# Patient Record
Sex: Female | Born: 1955 | Race: Black or African American | Hispanic: No | Marital: Married | Smoking: Never smoker
Health system: Southern US, Community
[De-identification: ages and names within clinical notes are randomized; demographics above are authoritative.]

## PROBLEM LIST (undated history)

## (undated) DIAGNOSIS — F99 Mental disorder, not otherwise specified: Secondary | ICD-10-CM

## (undated) DIAGNOSIS — I82409 Acute embolism and thrombosis of unspecified deep veins of unspecified lower extremity: Secondary | ICD-10-CM

## (undated) DIAGNOSIS — F419 Anxiety disorder, unspecified: Secondary | ICD-10-CM

## (undated) DIAGNOSIS — I739 Peripheral vascular disease, unspecified: Secondary | ICD-10-CM

---

## 2005-09-21 ENCOUNTER — Emergency Department (HOSPITAL_COMMUNITY): Admission: EM | Admit: 2005-09-21 | Discharge: 2005-09-21 | Payer: Self-pay | Admitting: Family Medicine

## 2005-12-03 ENCOUNTER — Emergency Department (HOSPITAL_COMMUNITY): Admission: EM | Admit: 2005-12-03 | Discharge: 2005-12-03 | Payer: Self-pay | Admitting: Emergency Medicine

## 2005-12-03 ENCOUNTER — Emergency Department (HOSPITAL_COMMUNITY): Admission: EM | Admit: 2005-12-03 | Discharge: 2005-12-04 | Payer: Self-pay | Admitting: Emergency Medicine

## 2005-12-04 ENCOUNTER — Inpatient Hospital Stay (HOSPITAL_COMMUNITY): Admission: RE | Admit: 2005-12-04 | Discharge: 2005-12-07 | Payer: Self-pay | Admitting: *Deleted

## 2005-12-04 ENCOUNTER — Ambulatory Visit: Payer: Self-pay | Admitting: Psychiatry

## 2005-12-06 ENCOUNTER — Emergency Department (HOSPITAL_COMMUNITY): Admission: EM | Admit: 2005-12-06 | Discharge: 2005-12-06 | Payer: Self-pay | Admitting: Emergency Medicine

## 2007-07-08 ENCOUNTER — Encounter: Admission: RE | Admit: 2007-07-08 | Discharge: 2007-07-08 | Payer: Self-pay | Admitting: Internal Medicine

## 2007-11-18 ENCOUNTER — Encounter: Admission: RE | Admit: 2007-11-18 | Discharge: 2007-11-18 | Payer: Self-pay | Admitting: Internal Medicine

## 2007-12-02 ENCOUNTER — Inpatient Hospital Stay (HOSPITAL_COMMUNITY): Admission: RE | Admit: 2007-12-02 | Discharge: 2007-12-07 | Payer: Self-pay | Admitting: Psychiatry

## 2007-12-02 ENCOUNTER — Ambulatory Visit: Payer: Self-pay | Admitting: Psychiatry

## 2007-12-19 ENCOUNTER — Emergency Department (HOSPITAL_COMMUNITY): Admission: EM | Admit: 2007-12-19 | Discharge: 2007-12-19 | Payer: Self-pay | Admitting: Emergency Medicine

## 2007-12-26 ENCOUNTER — Ambulatory Visit (HOSPITAL_COMMUNITY): Payer: Self-pay | Admitting: Psychiatry

## 2008-01-27 ENCOUNTER — Ambulatory Visit (HOSPITAL_COMMUNITY): Payer: Self-pay | Admitting: Psychiatry

## 2008-02-26 ENCOUNTER — Ambulatory Visit (HOSPITAL_COMMUNITY): Payer: Self-pay | Admitting: Psychiatry

## 2008-06-15 ENCOUNTER — Ambulatory Visit (HOSPITAL_COMMUNITY): Payer: Self-pay | Admitting: Psychiatry

## 2008-07-28 ENCOUNTER — Ambulatory Visit (HOSPITAL_COMMUNITY): Payer: Self-pay | Admitting: Psychiatry

## 2008-10-27 ENCOUNTER — Ambulatory Visit (HOSPITAL_COMMUNITY): Payer: Self-pay | Admitting: Psychiatry

## 2009-01-26 ENCOUNTER — Ambulatory Visit (HOSPITAL_COMMUNITY): Payer: Self-pay | Admitting: Psychiatry

## 2009-04-27 ENCOUNTER — Ambulatory Visit (HOSPITAL_COMMUNITY): Payer: Self-pay | Admitting: Psychiatry

## 2009-08-20 ENCOUNTER — Ambulatory Visit (HOSPITAL_COMMUNITY): Payer: Self-pay | Admitting: Psychiatry

## 2009-12-17 ENCOUNTER — Ambulatory Visit (HOSPITAL_COMMUNITY): Payer: Self-pay | Admitting: Psychiatry

## 2010-04-01 ENCOUNTER — Ambulatory Visit (HOSPITAL_COMMUNITY): Payer: Self-pay | Admitting: Psychiatry

## 2010-07-01 ENCOUNTER — Ambulatory Visit (HOSPITAL_COMMUNITY): Payer: Self-pay | Admitting: Psychiatry

## 2010-11-11 ENCOUNTER — Encounter (HOSPITAL_COMMUNITY): Payer: Self-pay | Admitting: Physician Assistant

## 2010-12-06 ENCOUNTER — Encounter (HOSPITAL_COMMUNITY): Payer: Medicaid Other | Admitting: Physician Assistant

## 2010-12-06 DIAGNOSIS — F3110 Bipolar disorder, current episode manic without psychotic features, unspecified: Secondary | ICD-10-CM
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
## 2010-12-06 NOTE — Discharge Summary (Signed)
NAMEFLORANCE, PAOLILLO NO.:  [id number]   MEDICAL RECORD NO.:  [id number]          PATIENT TYPE:  IPS   LOCATION:  0407                          FACILITY:  [facility]   PHYSICIAN:  Antonietta Breach, M.D.  DATE OF BIRTH:  12/25/1955   DATE OF ADMISSION:  12/02/2007  DATE OF DISCHARGE:  12/07/2007                               DISCHARGE SUMMARY   IDENTIFYING DATA/REASON FOR ADMISSION:  Ms. Laurie Hunt is a 55-year-  old female admitted to the [facility] inpatient psychiatric unit on Dec 02, 2007, with racing thoughts, increased energy, decreased need for  sleep, impaired judgment, pressured speech and poor insight.  The  patient was on 187.5 mg of Effexor per day along with Tegretol 200 mg  t.i.d.   She was intrusive and socially disruptive and grandiose.   Medical and laboratory unremarkable.   HOSPITAL COURSE:  Ms. Laurie Hunt was admitted to the adult inpatient  psychiatric unit and underwent milieu and group psychotherapy.  She was  started on Abilify which was titrated to 20 mg daily.  Her Tegretol was  increased to 200 mg q.i.d.  Her Effexor was discontinued.   Her thought process progressively decreased in speed down to normal.  Her speech became non-pressured.  Her affect became normal.  She became  socially appropriate.  Her judgment returned.   CONDITION ON DISCHARGE:  By Dec 07, 2007, the patient was socially  appropriate.  She had met with her son and daughter along with staff and  they all concurred that the patient was socially appropriate for  discharge.  She was not having any thoughts of harming herself or  others.  She was expressing motivation to continue her psychiatric care  as an outpatient.   AFTERCARE:  The patient will use emergency services as needed for any  psychiatric emergency symptoms.   The patient has follow up with Yolande Jolly, PA, at the [facility].   DISCHARGE MEDICATIONS:  1. Abilify 20 mg p.o. nightly.  2. Tegretol 200 mg 2 tablets twice daily.   DISCHARGE DIAGNOSES:  Axis I:  Bipolar I disorder, manic, now in  clinically remission.  Axis II:  None.  Axis III:  None.  Axis V:  Primary support group.  Axis V:  Global Assessment of Functioning 55.      Antonietta Breach, M.D.  Electronically Signed     JW/MEDQ  D:  12/11/2007  T:  12/11/2007  Job:  [id number]
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
## 2011-04-11 ENCOUNTER — Encounter (HOSPITAL_COMMUNITY): Payer: Medicaid Other | Admitting: Physician Assistant

## 2011-04-19 LAB — URINALYSIS, ROUTINE W REFLEX MICROSCOPIC
Glucose, UA: NEGATIVE
Hgb urine dipstick: NEGATIVE
Nitrite: NEGATIVE
Urobilinogen, UA: 0.2

## 2011-04-19 LAB — POCT I-STAT, CHEM 8
BUN: 8
Chloride: 100
Creatinine, Ser: 0.8
Glucose, Bld: 98
Sodium: 136

## 2011-04-19 LAB — POCT CARDIAC MARKERS
CKMB, poc: 2.1
Myoglobin, poc: 47
Myoglobin, poc: 49.9
Myoglobin, poc: 58.5
Operator id: 114141
Operator id: 282201
Operator id: 284141

## 2011-04-19 LAB — DIFFERENTIAL
Eosinophils Relative: 0
Lymphocytes Relative: 24
Lymphs Abs: 1.5
Monocytes Relative: 7
Neutro Abs: 4.3
Neutrophils Relative %: 68

## 2011-04-19 LAB — URINE MICROSCOPIC-ADD ON

## 2011-04-19 LAB — RAPID URINE DRUG SCREEN, HOSP PERFORMED
Amphetamines: NOT DETECTED
Benzodiazepines: NOT DETECTED
Cocaine: NOT DETECTED
Tetrahydrocannabinol: NOT DETECTED

## 2011-04-19 LAB — CBC
MCV: 84.6
Platelets: 323
RDW: 13.3
WBC: 6.3

## 2011-05-16 ENCOUNTER — Encounter (HOSPITAL_COMMUNITY): Payer: Medicaid Other | Admitting: Physician Assistant

## 2011-06-02 ENCOUNTER — Other Ambulatory Visit (HOSPITAL_COMMUNITY): Payer: Self-pay | Admitting: Physician Assistant

## 2011-06-02 DIAGNOSIS — F3110 Bipolar disorder, current episode manic without psychotic features, unspecified: Secondary | ICD-10-CM

## 2011-06-02 MED ORDER — BENZTROPINE MESYLATE 0.5 MG PO TABS: 0.5000 mg | ORAL_TABLET | Freq: Two times a day (BID) | ORAL | Status: DC

## 2011-06-02 MED ORDER — ARIPIPRAZOLE 30 MG PO TABS: 30.0000 mg | ORAL_TABLET | Freq: Every day | ORAL | Status: DC

## 2011-06-22 ENCOUNTER — Other Ambulatory Visit (HOSPITAL_COMMUNITY): Payer: Self-pay | Admitting: Physician Assistant

## 2011-06-27 ENCOUNTER — Other Ambulatory Visit (HOSPITAL_COMMUNITY): Payer: Self-pay | Admitting: Physician Assistant

## 2011-06-29 ENCOUNTER — Ambulatory Visit (INDEPENDENT_AMBULATORY_CARE_PROVIDER_SITE_OTHER): Payer: Medicare Other | Admitting: Physician Assistant

## 2011-06-29 DIAGNOSIS — F3110 Bipolar disorder, current episode manic without psychotic features, unspecified: Secondary | ICD-10-CM

## 2011-07-12 NOTE — Progress Notes (Signed)
   [facility] Behavioral Health Follow-up Outpatient Visit  Laurie Hunt 09-23-1955  Date: 06/29/11   Subjective:  Laurie Hunt reports she is doing extremely well. She reports that her mood is stable. She has been happy. She denies any suicidal or homicidal ideation. She denies any paranoia, auditory or visual hallucinations. She endorses good sleep and appetite. She continues to spend a lot of time at [employer], all using the computers. She has no plans to return to school at this time.  There were no vitals filed for this visit.  Mental Status Examination  Appearance: Well groomed and dressed Alert: Yes Attention: good  Cooperative: Yes Eye Contact: Good Speech: Clear and even Psychomotor Activity: Normal Memory/Concentration: Intact Oriented: person, place, time/date and situation Mood: Euphoric Affect: Bizarre Thought Processes and Associations: Logical Fund of Knowledge: Fair Thought Content:  Insight: Fair Judgement: Good  Diagnosis: Bipolar disorder, most recent episode manic  Treatment Plan: Continue medications as prescribed and return for followup in 6 months.  Laurie Bostock, PA

## 2011-07-21 ENCOUNTER — Other Ambulatory Visit (HOSPITAL_COMMUNITY): Payer: Self-pay | Admitting: Physician Assistant

## 2011-07-21 DIAGNOSIS — F29 Unspecified psychosis not due to a substance or known physiological condition: Secondary | ICD-10-CM

## 2011-09-29 ENCOUNTER — Other Ambulatory Visit (HOSPITAL_COMMUNITY): Payer: Self-pay | Admitting: Physician Assistant

## 2011-10-18 ENCOUNTER — Other Ambulatory Visit (HOSPITAL_COMMUNITY): Payer: Self-pay | Admitting: Physician Assistant

## 2011-10-18 DIAGNOSIS — F319 Bipolar disorder, unspecified: Secondary | ICD-10-CM

## 2011-12-28 ENCOUNTER — Ambulatory Visit (HOSPITAL_COMMUNITY): Payer: Medicare Other | Admitting: Physician Assistant

## 2012-01-13 ENCOUNTER — Other Ambulatory Visit (HOSPITAL_COMMUNITY): Payer: Self-pay | Admitting: Physician Assistant

## 2012-01-26 ENCOUNTER — Other Ambulatory Visit (HOSPITAL_COMMUNITY): Payer: Self-pay | Admitting: Physician Assistant

## 2012-01-26 DIAGNOSIS — F3110 Bipolar disorder, current episode manic without psychotic features, unspecified: Secondary | ICD-10-CM

## 2012-02-07 ENCOUNTER — Ambulatory Visit (INDEPENDENT_AMBULATORY_CARE_PROVIDER_SITE_OTHER): Payer: Medicare Other | Admitting: Physician Assistant

## 2012-02-07 DIAGNOSIS — F319 Bipolar disorder, unspecified: Secondary | ICD-10-CM | POA: Insufficient documentation

## 2012-02-07 NOTE — Progress Notes (Signed)
   [facility] Behavioral Health Follow-up Outpatient Visit  Laurie Hunt [birth year]  Date: 02/07/2012   Subjective: Laurie Hunt presents today to followup on her treatment for bipolar disorder. She reports that she is doing very well. She endorses a stable mood. She states that her sleep is good, and her appetite is good and she eats healthy. 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 She denies any suicidal or homicidal ideation. She denies any auditory or visual hallucinations.  There were no vitals filed for this visit.  Mental Status Examination  Appearance: Well groomed and casually dressed Alert: Yes Attention: good  Cooperative: Yes Eye Contact: Good Speech: Clear and coherent Psychomotor Activity: Normal Memory/Concentration: Intact Oriented: person, place, time/date and situation Mood: Euphoric and Euthymic Affect: Bizarre Thought Processes and Associations: Linear Fund of Knowledge: Good Thought Content: Normal Insight: Good Judgement: Good  Diagnosis: Bipolar disorder  Treatment Plan: We'll continue her Abilify 30 mg daily Cogentin 0.5 mg twice daily, and Topamax 100 mg twice daily. She will return for followup in 6 months.  Rhealyn Cullen, PA-C

## 2012-02-13 ENCOUNTER — Ambulatory Visit (HOSPITAL_COMMUNITY): Payer: Medicare Other | Admitting: Physician Assistant

## 2012-03-02 ENCOUNTER — Other Ambulatory Visit (HOSPITAL_COMMUNITY): Payer: Self-pay | Admitting: Physician Assistant

## 2012-04-16 ENCOUNTER — Other Ambulatory Visit (HOSPITAL_COMMUNITY): Payer: Self-pay | Admitting: Physician Assistant

## 2012-07-23 ENCOUNTER — Ambulatory Visit: Payer: Medicare Other

## 2012-07-23 ENCOUNTER — Ambulatory Visit (INDEPENDENT_AMBULATORY_CARE_PROVIDER_SITE_OTHER): Payer: Medicare Other | Admitting: Family Medicine

## 2012-07-23 VITALS — BP 122/82 | HR 83 | Temp 98.3°F | Resp 16 | Ht 64.0 in | Wt 253.0 lb

## 2012-07-23 DIAGNOSIS — R82998 Other abnormal findings in urine: Secondary | ICD-10-CM

## 2012-07-23 DIAGNOSIS — M549 Dorsalgia, unspecified: Secondary | ICD-10-CM

## 2012-07-23 DIAGNOSIS — R8281 Pyuria: Secondary | ICD-10-CM

## 2012-07-23 LAB — POCT URINALYSIS DIPSTICK
Blood, UA: NEGATIVE
Glucose, UA: NEGATIVE
Nitrite, UA: NEGATIVE
Spec Grav, UA: >=1.03

## 2012-07-23 LAB — POCT UA - MICROSCOPIC ONLY
Crystals, Ur, HPF, POC: NEGATIVE
Mucus, UA: POSITIVE
Yeast, UA: NEGATIVE

## 2012-07-23 MED ORDER — MELOXICAM 7.5 MG PO TABS: 7.5000 mg | ORAL_TABLET | Freq: Every day | ORAL | Status: DC

## 2012-07-23 NOTE — Progress Notes (Signed)
Subjective:    Patient ID: Laurie Hunt, female    DOB: September 09, 1955, 56 y.o.   MRN: [id number]  HPI Laurie Hunt is a 56 y.o. female approx 2 week hx of LBP - both sides, NKi, no falls. Noticed after standing in cold for 20 minutes. No urinary symptoms. No bowel or bladder incontinence, no saddle anesthesia, no lower extremity weakness.   Tx: advil - otc up to every 4 hours.   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    Review of Systems  Constitutional: Negative for fever and chills.  Respiratory: Negative for shortness of breath.   Gastrointestinal: Negative for abdominal pain.  Genitourinary: Negative for dysuria, urgency, frequency, hematuria and vaginal bleeding.  Musculoskeletal: Positive for back pain.  Skin: Negative for rash.  Neurological: Negative for weakness.       Objective:   Physical Exam  Vitals reviewed. Constitutional: She is oriented to person, place, and time. She appears well-developed and well-nourished.       Overweight.  HENT:  Head: Normocephalic and atraumatic.  Pulmonary/Chest: Effort normal.  Abdominal: Soft. Normal appearance. There is no tenderness. There is CVA tenderness (ttp over paraspinals. ).  Musculoskeletal:       Lumbar back: She exhibits decreased range of motion, tenderness, bony tenderness and spasm. She exhibits no swelling and no edema.       Back:  Neurological: She is alert and oriented to person, place, and time. She has normal strength. She displays no Babinski's sign on the right side. She displays no Babinski's sign on the left side.  Reflex Scores:      Patellar reflexes are 1+ on the right side and 1+ on the left side.      Achilles reflexes are 1+ on the right side and 1+ on the left side. Skin: Skin is warm and dry. No rash noted.  Psychiatric: She has a normal mood and affect. Her behavior is normal.    Results for orders placed in visit on 07/23/12  POCT UA - MICROSCOPIC ONLY   Component Value Range   WBC, Ur, HPF, POC 1-6     RBC, urine, microscopic 0-2     Bacteria, U Microscopic trace     Mucus, UA positive     Epithelial cells, urine per micros 0-2     Crystals, Ur, HPF, POC neg     Casts, Ur, LPF, POC neg     Yeast, UA neg     Amorphous pos    POCT URINALYSIS DIPSTICK      Component Value Range   Color, UA orange     Clarity, UA cloudy     Glucose, UA neg     Bilirubin, UA small     Ketones, UA trace     Spec Grav, UA >=1.030     Blood, UA neg     pH, UA 5.0     Protein, UA trace     Urobilinogen, UA 1.0     Nitrite, UA neg     Leukocytes, UA small (1+)      [facility] reading (PRIMARY) by  Dr. Neva Seat: LS spine: nad.      Assessment & Plan:  Laurie Hunt is a 57 y.o. female  1. Back pain  POCT UA - Microscopic Only, POCT urinalysis dipstick, DG Lumbar Spine 2-3 Views, meloxicam (MOBIC) 7.5 MG tablet  2. Pyuria  Urine culture    LBP - -paraspinal low back, likely  strain/deconditiioning. Trail of HEP, heat, rom, Mobic.  Will check urine cx with slight pyuria, but asx - hold on abx's at this point. rtc precautions discussed including any urinary symptoms.   Patient Instructions  You likely have pulled a muscle or strain of your back.  Try the mobic and recommendations in the book.Return to the clinic or go to the nearest emergency room if any of your symptoms worsen or new symptoms occur. Your should receive a call or letter about your lab results within the next week to 10 days. If nay new or worsening urinary symptoms - return to clinic.

## 2012-07-23 NOTE — Patient Instructions (Signed)
You likely have pulled a muscle or strain of your back.  Try the mobic and recommendations in the book.Return to the clinic or go to the nearest emergency room if any of your symptoms worsen or new symptoms occur. Your should receive a call or letter about your lab results within the next week to 10 days. If nay new or worsening urinary symptoms - return to clinic.

## 2012-07-25 LAB — URINE CULTURE

## 2012-08-12 ENCOUNTER — Ambulatory Visit (HOSPITAL_COMMUNITY): Payer: Self-pay | Admitting: Physician Assistant

## 2012-08-29 ENCOUNTER — Other Ambulatory Visit (HOSPITAL_COMMUNITY): Payer: Self-pay | Admitting: Physician Assistant

## 2012-08-29 ENCOUNTER — Ambulatory Visit (INDEPENDENT_AMBULATORY_CARE_PROVIDER_SITE_OTHER): Payer: Medicare Other | Admitting: Physician Assistant

## 2012-08-29 DIAGNOSIS — F319 Bipolar disorder, unspecified: Secondary | ICD-10-CM

## 2012-08-29 NOTE — Progress Notes (Signed)
   [facility] Behavioral Health Follow-up Outpatient Visit  Laurie Hunt 07/04/1956  Date: 08/29/2012   Subjective: Laurie Hunt presents today to followup on her treatment for bipolar disorder. She continues to endorse that she is doing well. She denies any symptoms of depression, and reports that she doesn't hold things in the way she used to which helps her mood to be stable. She reports that she is eating well and she gets about 8 hours of sleep per night. She continues to spend a lot of time reading, and goes to [employer] frequently. 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 She also spends time with her friend, and as much as possible, her family. She denies any suicidal or homicidal ideation. She denies any auditory or visual hallucinations, or paranoid delusions.  There were no vitals filed for this visit.  Mental Status Examination  Appearance: Well groomed and nicely dressed Alert: Yes Attention: good  Cooperative: Yes Eye Contact: Good Speech: Clear and coherent Psychomotor Activity: Normal Memory/Concentration: Intact Oriented: person, place, time/date and situation Mood: Euthymic Affect: Bizarre Thought Processes and Associations: Logical Fund of Knowledge: Fair Thought Content: Normal Insight: Fair Judgement: Good  Diagnosis: Bipolar disorder NOS  Treatment Plan: We will continue her Abilify 30 mg daily, Cogentin 0.5 mg twice daily, and Topamax 100 mg twice daily. She will return for followup in 6 months.  Fitz Matsuo, PA-C

## 2012-12-11 ENCOUNTER — Other Ambulatory Visit (HOSPITAL_COMMUNITY): Payer: Self-pay | Admitting: Physician Assistant

## 2013-02-18 ENCOUNTER — Other Ambulatory Visit (HOSPITAL_COMMUNITY): Payer: Self-pay | Admitting: Physician Assistant

## 2013-02-26 ENCOUNTER — Ambulatory Visit (INDEPENDENT_AMBULATORY_CARE_PROVIDER_SITE_OTHER): Payer: Medicare Other | Admitting: Physician Assistant

## 2013-02-26 ENCOUNTER — Encounter (HOSPITAL_COMMUNITY): Payer: Self-pay | Admitting: Physician Assistant

## 2013-02-26 VITALS — BP 125/75 | HR 75 | Ht 65.0 in | Wt 240.0 lb

## 2013-02-26 DIAGNOSIS — F319 Bipolar disorder, unspecified: Secondary | ICD-10-CM

## 2013-02-26 NOTE — Progress Notes (Signed)
   [facility] Behavioral Health Follow-up Outpatient Visit  KYASIA STEUCK 1956/07/12  Date: 02/26/2013   Subjective: Diane presents today to followup on her treatment for bipolar disorder. She reports that her mood has been stable. She denies any periods of depression or anxiety, or mania. She reports that she is sleeping well, and gets a full 8 hours per night. She reports that her appetite has been good and she may have lost a little weight. She has been trying to walk and do some resistance exercises in or to trim down. She denies any suicidal or homicidal ideation. She denies any auditory or visual hallucinations.   Filed Vitals:   02/26/13 1413  BP: 125/75  Pulse: 75    Mental Status Examination  Appearance:  Casual  Alert: Yes Attention: good  Cooperative: Yes Eye Contact: Good Speech:  clear and coherent  Psychomotor Activity: Normal Memory/Concentration:  intact Oriented: person, place, time/date and situation Mood: Euthymic Affect: Inappropriate Thought Processes and Associations: Linear and Logical Fund of Knowledge: Good Thought Content: Normal  Insight: Fair Judgement: Good  Diagnosis:  bipolar disorder not otherwise specified   Treatment Plan: we will continue her Abilify 30 mg daily, Cogentin 0.5 mg twice daily, and Topamax 100 mg twice daily. She will return for followup in 6 months. She is encouraged to call between appointments if there are concerns.   Cereniti Curb, PA-C

## 2013-03-09 ENCOUNTER — Other Ambulatory Visit (HOSPITAL_COMMUNITY): Payer: Self-pay | Admitting: Physician Assistant

## 2013-03-09 DIAGNOSIS — F319 Bipolar disorder, unspecified: Secondary | ICD-10-CM

## 2013-04-10 ENCOUNTER — Ambulatory Visit (INDEPENDENT_AMBULATORY_CARE_PROVIDER_SITE_OTHER): Payer: Medicare Other | Admitting: Emergency Medicine

## 2013-04-10 ENCOUNTER — Inpatient Hospital Stay (HOSPITAL_COMMUNITY)
Admission: AD | Admit: 2013-04-10 | Discharge: 2013-04-12 | DRG: 301 | Disposition: A | Discharge status: Home / Self Care | Payer: Medicare Other | Source: Ambulatory Visit | Attending: Family Medicine | Admitting: Family Medicine

## 2013-04-10 ENCOUNTER — Ambulatory Visit: Payer: Medicare Other

## 2013-04-10 ENCOUNTER — Ambulatory Visit (HOSPITAL_COMMUNITY)
Admission: RE | Admit: 2013-04-10 | Discharge: 2013-04-10 | Disposition: A | Discharge status: Home / Self Care | Payer: Medicare Other | Source: Ambulatory Visit | Attending: Emergency Medicine | Admitting: Emergency Medicine

## 2013-04-10 ENCOUNTER — Other Ambulatory Visit (HOSPITAL_COMMUNITY): Payer: Self-pay | Admitting: Emergency Medicine

## 2013-04-10 ENCOUNTER — Encounter (HOSPITAL_COMMUNITY): Payer: Self-pay

## 2013-04-10 VITALS — BP 130/72 | HR 74 | Temp 98.5°F | Resp 18 | Ht 64.5 in | Wt 235.0 lb

## 2013-04-10 DIAGNOSIS — M25569 Pain in unspecified knee: Secondary | ICD-10-CM

## 2013-04-10 DIAGNOSIS — F411 Generalized anxiety disorder: Secondary | ICD-10-CM | POA: Diagnosis present

## 2013-04-10 DIAGNOSIS — I82409 Acute embolism and thrombosis of unspecified deep veins of unspecified lower extremity: Secondary | ICD-10-CM

## 2013-04-10 DIAGNOSIS — M25561 Pain in right knee: Secondary | ICD-10-CM

## 2013-04-10 DIAGNOSIS — I824Y9 Acute embolism and thrombosis of unspecified deep veins of unspecified proximal lower extremity: Principal | ICD-10-CM | POA: Diagnosis present

## 2013-04-10 DIAGNOSIS — F319 Bipolar disorder, unspecified: Secondary | ICD-10-CM | POA: Diagnosis present

## 2013-04-10 DIAGNOSIS — M7989 Other specified soft tissue disorders: Secondary | ICD-10-CM

## 2013-04-10 DIAGNOSIS — M79661 Pain in right lower leg: Secondary | ICD-10-CM

## 2013-04-10 DIAGNOSIS — M79609 Pain in unspecified limb: Secondary | ICD-10-CM

## 2013-04-10 DIAGNOSIS — I824Z9 Acute embolism and thrombosis of unspecified deep veins of unspecified distal lower extremity: Secondary | ICD-10-CM | POA: Diagnosis present

## 2013-04-10 DIAGNOSIS — Z7901 Long term (current) use of anticoagulants: Secondary | ICD-10-CM

## 2013-04-10 DIAGNOSIS — D649 Anemia, unspecified: Secondary | ICD-10-CM | POA: Diagnosis present

## 2013-04-10 DIAGNOSIS — Z79899 Other long term (current) drug therapy: Secondary | ICD-10-CM

## 2013-04-10 HISTORY — DX: Peripheral vascular disease, unspecified: I73.9

## 2013-04-10 HISTORY — DX: Anxiety disorder, unspecified: F41.9

## 2013-04-10 HISTORY — DX: Mental disorder, not otherwise specified: F99

## 2013-04-10 LAB — POCT CBC
Granulocyte percent: 60.3 %G (ref 37–80)
Hemoglobin: 11.7 g/dL — AB (ref 12.2–16.2)
MID (cbc): 0.2 (ref 0–0.9)
MPV: 7.6 fL (ref 0–99.8)
POC Granulocyte: 2.7 (ref 2–6.9)
POC MID %: 3.6 %M (ref 0–12)
Platelet Count, POC: 223 10*3/uL (ref 142–424)
RBC: 3.85 M/uL — AB (ref 4.04–5.48)

## 2013-04-10 LAB — COMPREHENSIVE METABOLIC PANEL
ALT: 24 U/L (ref 0–35)
BUN: 8 mg/dL (ref 6–23)
CO2: 24 mEq/L (ref 19–32)
Calcium: 8.8 mg/dL (ref 8.4–10.5)
Creatinine, Ser: 0.72 mg/dL (ref 0.50–1.10)
GFR calc Af Amer: >90 mL/min (ref >90)
GFR calc non Af Amer: >90 mL/min (ref >90)
Glucose, Bld: 156 mg/dL — ABNORMAL HIGH (ref 70–99)
Total Protein: 7.6 g/dL (ref 6.0–8.3)

## 2013-04-10 LAB — CBC
HCT: 34 % — ABNORMAL LOW (ref 36.0–46.0)
Hemoglobin: 11.9 g/dL — ABNORMAL LOW (ref 12.0–15.0)
MCHC: 35 g/dL (ref 30.0–36.0)
WBC: 4.7 10*3/uL (ref 4.0–10.5)

## 2013-04-10 LAB — PROTIME-INR
INR: 1.07 (ref 0.00–1.49)
Prothrombin Time: 13.7 seconds (ref 11.6–15.2)

## 2013-04-10 MED ORDER — ONDANSETRON HCL 4 MG PO TABS: 4.0000 mg | ORAL_TABLET | Freq: Four times a day (QID) | ORAL | Status: DC | PRN

## 2013-04-10 MED ORDER — HEPARIN BOLUS VIA INFUSION
4000.0000 [IU] | Freq: Once | INTRAVENOUS | Status: AC
  Administered 2013-04-10: 4000 [IU] via INTRAVENOUS
  Filled 2013-04-10: qty 4000

## 2013-04-10 MED ORDER — ACETAMINOPHEN 325 MG PO TABS: 650.0000 mg | ORAL_TABLET | Freq: Four times a day (QID) | ORAL | Status: DC | PRN

## 2013-04-10 MED ORDER — SODIUM CHLORIDE 0.9 % IV SOLN: 250.0000 mL | INTRAVENOUS | Status: DC | PRN

## 2013-04-10 MED ORDER — ARIPIPRAZOLE 10 MG PO TABS
30.0000 mg | ORAL_TABLET | Freq: Every day | ORAL | Status: DC
  Administered 2013-04-10 – 2013-04-11 (×2): 30 mg via ORAL
  Filled 2013-04-10 (×2): qty 3
  Filled 2013-04-10: qty 2

## 2013-04-10 MED ORDER — BENZTROPINE MESYLATE 0.5 MG PO TABS
0.5000 mg | ORAL_TABLET | Freq: Two times a day (BID) | ORAL | Status: DC
  Administered 2013-04-10 – 2013-04-12 (×4): 0.5 mg via ORAL
  Filled 2013-04-10 (×5): qty 1

## 2013-04-10 MED ORDER — SODIUM CHLORIDE 0.9 % IJ SOLN
3.0000 mL | Freq: Two times a day (BID) | INTRAMUSCULAR | Status: DC
  Administered 2013-04-10 – 2013-04-12 (×3): 3 mL via INTRAVENOUS

## 2013-04-10 MED ORDER — HEPARIN (PORCINE) IN NACL 100-0.45 UNIT/ML-% IJ SOLN
1400.0000 [IU]/h | INTRAMUSCULAR | Status: DC
  Administered 2013-04-10 – 2013-04-11 (×2): 1400 [IU]/h via INTRAVENOUS
  Filled 2013-04-10 (×3): qty 250

## 2013-04-10 MED ORDER — ACETAMINOPHEN 650 MG RE SUPP: 650.0000 mg | Freq: Four times a day (QID) | RECTAL | Status: DC | PRN

## 2013-04-10 MED ORDER — SODIUM CHLORIDE 0.9 % IJ SOLN: 3.0000 mL | INTRAMUSCULAR | Status: DC | PRN

## 2013-04-10 MED ORDER — ONDANSETRON HCL 4 MG/2ML IJ SOLN: 4.0000 mg | Freq: Four times a day (QID) | INTRAMUSCULAR | Status: DC | PRN

## 2013-04-10 MED ORDER — TOPIRAMATE 100 MG PO TABS
100.0000 mg | ORAL_TABLET | Freq: Two times a day (BID) | ORAL | Status: DC
  Administered 2013-04-10 – 2013-04-12 (×4): 100 mg via ORAL
  Filled 2013-04-10 (×5): qty 1

## 2013-04-10 NOTE — H&P (Signed)
Family Medicine Teaching [facility] Admission History and Physical Service Pager: [phone]  Patient name: Laurie Hunt Medical record number: [id number] Date of birth: 04-Dec-1955 Age: 57 y.o. Gender: female  Primary Care Provider: Pcp Not In System Consultants: None Code Status: Full  Chief Complaint: calf tenderness and swelling  Assessment and Plan: Laurie Hunt is a 57 y.o. female presenting with calf tenderness and swelling for the past 4 days found to have DVT in vasc lab today. PMH is significant for mood disorder? Seems to be bipolar from previous note  #DVT- with extensive clot burden in right leg. Appears onset was 4 days ago. No hx of clots, prolonged immobilization, smoking hx or hormone use. Has had weight loss recently but this was intentional. No night sweats, B symptoms suggestive of a malignancy (although patient is not up to date on colonoscopy and mammogram). No clear etiology at this time. Extensive hypercoag work up initiated with visit today including lupus anticoag, Aptt, homocysteine, cadiolipin antibody, beta2 glycoprotin, factor 5 leiden, prothrombin gene mutation, inr, protein c/s, antithrombin. Not currently in pain.  Plan- -f/up on w/up initiated by PCP -heparin per pharm consult - xarelto vs coumadin for long term anticoagulation. Factors like compliance, lack of PCP and insurance will come into play for this decision. Re-evaluate choice in the morning with help of case management.  -am labs CBC, BMP, daily heparin, INR -zofran -tylenol  #mood swings-likely bipolar Home medications appear to help. Abilify, cogentin, topamax. Last seen in behavioral health 02/26/13. Pt extremely anxious about getting her medications. Noted to be having shaking and lip trembling Plan- -restart home meds -no interactions with heparin apparent but will check with pharmacy   [facility]- has not had colonoscopy. Last mammogram >1 year ago but was normal at that time. No acute  for colonoscopy at this time Plan- -would recommend obtaining as outpt as malignancy can predispose to clots -needs to get a PCP  FEN/GI: gen diet Prophylaxis: on heparin for DVT  Disposition: admitted to tele, treatment of DVT  History of Present Illness: Laurie Hunt is a 57 y.o. female presenting with right ankle/leg swelling and pain for the past 4 days assoc with pain and tingling down lower right leg. Noted when getting up for church on Sunday am, thought it would resolve on its own. But not alleviated with rest, pain continued to get worse. No SOB, chest pain. Presented to urgent care today where there was a 3cm diff noted between her calves along with swelling and erythema. Hypercoag panel was drawn there and she was instructed to go to the hospital for vascular duplex. No hx of prolonged immobilization, malignancy, injury, blood clots, familial bleeding d/o. Patient is a non smoker, not on hormones.  In Vasc lab patient noted to be positive for right DVT involving  the right saphenofemoral junction, right common femoral vein, right femoral vein, right popliteal vein, right posterior tibial vein, and right peroneal vein. Patient was directly admitted for DVT treatment.   Review Of Systems: Per HPI with the following additions: Regular BM, urine output, no abdominal pain, no headaches, eating and drinking normally. Intentional weight loss. No fevers or chills.  Otherwise 12 point review of systems was performed and was unremarkable.  Patient Active Problem List   Diagnosis Date Noted  . Bipolar disorder, unspecified 02/07/2012   Past Medical History: No past medical history on file. Past Surgical History: No past surgical history on file. Social History: History  Substance Use Topics  .  Smoking status: Never Smoker   . Smokeless tobacco: Not on file  . Alcohol Use: No   Additional social history: Not on OCPs, never smoker Please also refer to relevant sections of  EMR.  Family History: No family history on file. Allergies and Medications: No Known Allergies No current facility-administered medications on file prior to encounter.   No current outpatient prescriptions on file prior to encounter.    Objective: BP 148/93  Pulse 74  Temp(Src) 98 F (36.7 C) (Oral)  Resp 17  Ht 5\' 5"  (1.651 m)  Wt 234 lb 12.6 oz (106.5 kg)  BMI 39.07 kg/m2  SpO2 98% Exam: General: NAD, shaking, appears anxious HEENT: NCAT, perrl, mmm Cardiovascular: RRR, no murmurs Respiratory: CTAB Abdomen: soft,nt,nd normoactive bs Extremities: left measuring 44cm vs right 41cm, also with erythema around popliteal fossa some tightness and mild tenderness, warmth noted along right leg Skin: as above, no additional rashes or bruising Neuro: a&o X4   Labs and Imaging: CBC BMET   Recent Labs Lab 04/10/13 1419  WBC 4.4*  HGB 11.7*  HCT 37.8   No results found for this basename: NA, K, CL, CO2, BUN, CREATININE, GLUCOSE, CALCIUM,  in the last 168 hours    Anselm Lis, MD 04/10/2013, 6:27 PM PGY-1, [facility] FPTS Intern pager: [phone], text pages welcome   Patient seen, examined. Available data reviewed. Agree with findings, assessment, and plan as outlined by Dr. Michail Jewels.  My additional findings are documented and highlighted above in blue.  Marena Chancy, PGY-3 Family Medicine Resident

## 2013-04-10 NOTE — Progress Notes (Signed)
*  Preliminary Results* Right lower extremity venous duplex completed. Right lower extremity is positive for deep vein thrombosis involving the right saphenofemoral junction, right common femoral vein, right femoral vein, right popliteal vein, right posterior tibial vein, and right peroneal vein. There is no evidence of right Baker's cyst.  Sherren Kerns, RVS has discussed preliminary results with Dr. Cleta Alberts on my behalf.  04/10/2013 3:42 PM  Gertie Fey, RVT, RDCS, RDMS

## 2013-04-10 NOTE — Progress Notes (Signed)
  Subjective:    Patient ID: Laurie Hunt, female    DOB: 03-May-1956, 57 y.o.   MRN: [id number]  HPI 57 year old female patient presents with right ankle/leg pain and swelling since Sunday (x 4 days). Tingling sensation on back of lower right leg. No known injury. No long car rides recently or plane travel. No history of blood clots.  No history of smoking, she is not taking any kind of hormones.    Review of Systems     Objective:   Physical Exam patient is tender in the right popliteal area and in the back of the right calf the measurement is done 5 inches below the inferior border of the patella. There is a 3 cm difference in calf circumference at this level. There is definite swelling noted of the entire calf and into the foot. Examination of the right groin did not reveal any clots or tenderness.  [facility] reading (PRIMARY) by  Dr. Cleta Alberts no fracture is seen. Results for orders placed in visit on 04/10/13  POCT CBC      Result Value Range   WBC 4.4 (*) 4.6 - 10.2 K/uL   Lymph, poc 1.6  0.6 - 3.4   POC LYMPH PERCENT 36.1  10 - 50 %L   MID (cbc) 0.2  0 - 0.9   POC MID % 3.6  0 - 12 %M   POC Granulocyte 2.7  2 - 6.9   Granulocyte percent 60.3  37 - 80 %G   RBC 3.85 (*) 4.04 - 5.48 M/uL   Hemoglobin 11.7 (*) 12.2 - 16.2 g/dL   HCT, POC 56.2  13.0 - 47.9 %   MCV 98.2 (*) 80 - 97 fL   MCH, POC 30.4  27 - 31.2 pg   MCHC 31.0 (*) 31.8 - 35.4 g/dL   RDW, POC 86.5     Platelet Count, POC 223  142 - 424 K/uL   MPV 7.6  0 - 99.8 fL    .       Assessment & Plan:  Hypercoag panel has been drawn. She is to go to : Hospital for a venous Doppler. I did receive a call she has extensive clot that goes up into the femoral vein and close to the bifurcation. I called the family medicine resident on call and arrange for direct admit .

## 2013-04-10 NOTE — Plan of Care (Signed)
Problem: Consults Goal: Diagnosis - Venous Thromboembolism (VTE) Choose a selection  Outcome: Not Progressing Venous Thromboembolism

## 2013-04-10 NOTE — Progress Notes (Addendum)
ANTICOAGULATION CONSULT NOTE - Initial Consult  Pharmacy Consult for heparin Indication: DVT  No Known Allergies  Patient Measurements: Height: 5\' 5"  (165.1 cm) Weight: 234 lb 12.6 oz (106.5 kg) IBW/kg (Calculated) : 57 Heparin Dosing Weight: 81.8 kg  Vital Signs: Temp: 98 F (36.7 C) (09/18 1741) Temp src: Oral (09/18 1741) BP: 148/93 mmHg (09/18 1741) Pulse Rate: 74 (09/18 1741)  Labs:  Recent Labs  04/10/13 1419  HGB 11.7*  HCT 37.8    Estimated Creatinine Clearance: 95.2 ml/min (by C-G formula based on Cr of 0.8).   Medical History: No past medical history on file.  Medications:  Prescriptions prior to admission  Medication Sig Dispense Refill  . ARIPiprazole (ABILIFY) 30 MG tablet Take 30 mg by mouth daily.      . benztropine (COGENTIN) 0.5 MG tablet Take 0.5 mg by mouth 2 (two) times daily.      . Multiple Vitamins-Minerals (MULTIVITAMIN WITH MINERALS) tablet Take 1 tablet by mouth daily.      Marland Kitchen topiramate (TOPAMAX) 100 MG tablet Take 100 mg by mouth 2 (two) times daily.        Assessment: Laurie Hunt is a 57 yo F admitted to start IV heparin for a right DVT extending from the saphenofemoral junction.  A hypercoag panel has been drawn.  Her wt is 106.5 kg with a heparin dosing wt of 81.8 kg.  Her H/H is 11.7/37.8., pltc 223.  A cmet is pending and baseline INR is pending.  Goal of Therapy:  Heparin level 0.3-0.7 units/ml Monitor platelets by anticoagulation protocol: Yes   Plan:  1. Heparin 4000 unit bolus  2. Heparin drip at 1400 units/hr  3. Check HL in 6 hrs 4. Daily HL and CBC 5. F/u for coumadin to start or transition to NOAC agent for VTE treatment. Herby Abraham, Pharm.D. [phone] 04/10/2013 6:54 PM   Addum:  Initial HL is therapeutic 0.6 units/ml.  Will cont same rate and recheck in 6 hours to verify.

## 2013-04-10 NOTE — Patient Instructions (Addendum)
Please go to [facility]. They will call us the results of your Doppler and we will decide on medical treatment after that is done

## 2013-04-11 LAB — APTT: aPTT: 33 seconds (ref 24–37)

## 2013-04-11 LAB — ANTITHROMBIN III: AntiThromb III Func: 127 % — ABNORMAL HIGH (ref 76–126)

## 2013-04-11 LAB — BASIC METABOLIC PANEL
BUN: 8 mg/dL (ref 6–23)
Chloride: 115 mEq/L — ABNORMAL HIGH (ref 96–112)
GFR calc Af Amer: >90 mL/min (ref >90)
Potassium: 3.6 mEq/L (ref 3.5–5.1)

## 2013-04-11 LAB — PROTEIN C, TOTAL: Protein C, Total: 91 % (ref 72–160)

## 2013-04-11 LAB — LUPUS ANTICOAGULANT PANEL
DRVVT: 28.6 secs (ref <42.9)
Lupus Anticoagulant: NOT DETECTED

## 2013-04-11 LAB — HEPARIN LEVEL (UNFRACTIONATED)
Heparin Unfractionated: 0.61 IU/mL (ref 0.30–0.70)
Heparin Unfractionated: 0.84 IU/mL — ABNORMAL HIGH (ref 0.30–0.70)

## 2013-04-11 LAB — BETA-2 GLYCOPROTEIN ANTIBODIES
Beta-2 Glyco I IgG: 0 G Units (ref <20)
Beta-2-Glycoprotein I IgM: 3 M Units (ref <20)

## 2013-04-11 LAB — PROTEIN S, TOTAL: Protein S Total: 131 % (ref 60–150)

## 2013-04-11 LAB — GLUCOSE, CAPILLARY: Glucose-Capillary: 120 mg/dL — ABNORMAL HIGH (ref 70–99)

## 2013-04-11 LAB — HOMOCYSTEINE: Homocysteine: 212 umol/L — ABNORMAL HIGH (ref 4.0–15.4)

## 2013-04-11 MED ORDER — HEPARIN (PORCINE) IN NACL 100-0.45 UNIT/ML-% IJ SOLN
1200.0000 [IU]/h | INTRAMUSCULAR | Status: AC
  Filled 2013-04-11: qty 250

## 2013-04-11 MED ORDER — RIVAROXABAN 15 MG PO TABS
15.0000 mg | ORAL_TABLET | Freq: Two times a day (BID) | ORAL | Status: DC
  Administered 2013-04-11 – 2013-04-12 (×2): 15 mg via ORAL
  Filled 2013-04-11 (×4): qty 1

## 2013-04-11 MED ORDER — RIVAROXABAN 20 MG PO TABS: 20.0000 mg | ORAL_TABLET | Freq: Every day | ORAL | Status: DC

## 2013-04-11 NOTE — Progress Notes (Addendum)
ANTICOAGULATION CONSULT NOTE - Follow Up Consult  Pharmacy Consult:  Heparin Indication:  DVT  No Known Allergies  Patient Measurements: Height: 5\' 5"  (165.1 cm) Weight: 234 lb 12.6 oz (106.5 kg) IBW/kg (Calculated) : 57 Heparin Dosing Weight: 82 kg  Vital Signs: Temp: 98.2 F (36.8 C) (09/19 0523) Temp src: Oral (09/19 0523) BP: 103/69 mmHg (09/19 0523) Pulse Rate: 68 (09/19 0523)  Labs:  Recent Labs  04/10/13 1411 04/10/13 1419 04/10/13 1920 04/11/13 0408  HGB  --  11.7* 11.9*  --   HCT  --  37.8 34.0*  --   PLT  --   --  203  --   APTT 33  --   --   --   LABPROT 13.6  --  13.7  --   INR 1.04  --  1.07  --   HEPARINUNFRC  --   --   --  0.61  CREATININE  --   --  0.72 0.77    Estimated Creatinine Clearance: 95.2 ml/min (by C-G formula based on Cr of 0.77).       Assessment: 35 YOF with new DVT to continue on IV heparin.  Heparin level supra-therapeutic; no bleeding reported.   Goal of Therapy:  Heparin level 0.3-0.7 units/ml Monitor platelets by anticoagulation protocol: Yes    Plan:  - Decrease heparin gtt to 1200 units/hr - Check 6 hr HL - Daily HL / CBC - F/U start PO anticoagulation when able    Nahia Nissan D. Laney Potash, PharmD, BCPS Pager:  [phone] 04/11/2013, 12:38 PM    ==============================  Addendum: Patient to transition from IV heparin to Xarelto in anticipation of discharge.  Patient's renal fxn is appropriate for Xarelto therapy.   Plan: - Xarelto 15mg  PO BIDWM x 21 days, then 20mg  PO daily with supper (stop IV heparin when Xarelto is administered - RN aware) - Monitor CBC, renal fxn periodically - Watch for s/sx of bleeding     Robet Crutchfield D. Laney Potash, PharmD, BCPS Pager:  [phone] 04/11/2013, 3:02 PM

## 2013-04-11 NOTE — Progress Notes (Signed)
Family Medicine Teaching Service Daily Progress Note Intern Pager: [phone]  Patient name: Laurie Hunt Medical record number: [id number] Date of birth: [birth year] Age: 57 y.o. Gender: female  Primary Care Provider: Pcp Not In System Consultants: None Code Status: Full  Pt Overview and Major Events to Date:   9/18: Rt Leg DVT: Extensive clot burden  Assessment and Plan: Laurie Hunt is a 57 y.o. female presenting with calf tenderness and swelling for the past 4 days found to have DVT in vasc lab today. PMH is significant for mood disorder? Seems to be bipolar from previous note   #DVT: Extensive clot burden in right leg. No clear etiology at this time. No hx of clots, prolonged immobilization, smoking hx or hormone use. No symptoms of CA (But not up to date on colonoscopy and mammogram). Mild anemia Hgb 11.7 & mild leukopenia 4.4 on Admission  Workup: Extensive hypercoag work up initiated: lupus anticoag, Aptt, homocysteine, cadiolipin antibody, beta2 glycoprotin, factor 5 leiden, prothrombin gene mutation, protein c/s, antithrombin III  PT/INR 13.7/1.07; Heparin level 0.61  CMP: Na 149, Cl 115, Gluc 101 otherwise wnl  Recheck tomorrow, Off IVF  Homocysteine: (H) 212  Treatment  heparin per pharm consult  C/s IR: will evaluate for possible thrombolysis  Case management c/s: xarelto vs coumadin for long term anticoagulation. (Factors: compliance, lack of PCP and insurance)  SW c/s to help establish PCP  # Bipolar / Anxiety:  - Pt extremely anxious about getting her medications. Noted to be having shaking and lip trembling.  Last seen in behavioral health 02/26/13.  - Continue:  Abilify, cogentin, topamax. - Will monitor PT/INR as Topamax can inc heparin metabolism  # HCM- has not had colonoscopy. Last mammogram >1 year ago but was normal at that time. Plan-  - Recommend Colonoscopy as out patient  - Needs a PCP   FEN/GI: gen diet  Prophylaxis: on heparin for  DVT  Disposition: Home pending transition to oral anticoagulates for treatment of DVT  Subjective: Denies pain in Rt leg or Chest pain/SOB. Eating and drinking well  Objective: Temp:  [98 F (36.7 C)-98.5 F (36.9 C)] 98.2 F (36.8 C) (09/18 2011) Pulse Rate:  [74-94] 94 (09/18 2011) Resp:  [17-18] 18 (09/18 2011) BP: (130-148)/(72-93) 143/75 mmHg (09/18 2011) SpO2:  [98 %] 98 % (09/18 2011) Weight:  [234 lb 12.6 oz (106.5 kg)-235 lb (106.595 kg)] 234 lb 12.6 oz (106.5 kg) (09/18 1740)  Physical Exam: Gen: WD/WN in NAD CV: RRR, No m/r/g Lungs: CTAB; normal resp. effort LE: Rt leg appears larger than left w trace edema to knee; no calf tenderness or pain with dorsiflexion; Pulses 2+ b/l; WWP  Laboratory: Results for orders placed during the hospital encounter of 04/10/13 (from the past 24 hour(s))  COMPREHENSIVE METABOLIC PANEL     Status: Abnormal   Collection Time    04/10/13  7:20 PM      Result Value Range   Sodium 143  135 - 145 mEq/L   Potassium 3.5  3.5 - 5.1 mEq/L   Chloride 107  96 - 112 mEq/L   CO2 24  19 - 32 mEq/L   Glucose, Bld 156 (*) 70 - 99 mg/dL   BUN 8  6 - 23 mg/dL   Creatinine, Ser 6.21  0.50 - 1.10 mg/dL   Calcium 8.8  8.4 - 30.8 mg/dL   Total Protein 7.6  6.0 - 8.3 g/dL   Albumin 4.0  3.5 - 5.2 g/dL  AST 22  0 - 37 U/L   ALT 24  0 - 35 U/L   Alkaline Phosphatase 81  39 - 117 U/L   Total Bilirubin 0.5  0.3 - 1.2 mg/dL   GFR calc non Af Amer >90  >90 mL/min   GFR calc Af Amer >90  >90 mL/min  CBC     Status: Abnormal   Collection Time    04/10/13  7:20 PM      Result Value Range   WBC 4.7  4.0 - 10.5 K/uL   RBC 3.80 (*) 3.87 - 5.11 MIL/uL   Hemoglobin 11.9 (*) 12.0 - 15.0 g/dL   HCT 46.9 (*) 62.9 - 52.8 %   MCV 89.5  78.0 - 100.0 fL   MCH 31.3  26.0 - 34.0 pg   MCHC 35.0  30.0 - 36.0 g/dL   RDW 41.3  24.4 - 01.0 %   Platelets 203  150 - 400 K/uL  PROTIME-INR     Status: None   Collection Time    04/10/13  7:20 PM      Result Value Range    Prothrombin Time 13.7  11.6 - 15.2 seconds   INR 1.07  0.00 - 1.49   Imaging/Diagnostic Tests:  RIGHT TIBIA AND FIBULA - 2 VIEW Findings: No fracture or dislocation is noted. Joint spaces are  intact. No soft tissue abnormality is noted.  Right lower extremity venous duplex completed Right lower extremity is positive for deep vein thrombosis involving the right saphenofemoral junction, right common femoral vein, right femoral vein, right popliteal vein, right posterior tibial vein, and right peroneal vein. There is no evidence of right Baker's cyst.  Wenda Low, MD 04/11/2013, 3:12 AM PGY-1, [facility] Family Medicine FPTS Intern pager: [phone], text pages welcome

## 2013-04-11 NOTE — Care Management Note (Unsigned)
    Page 1 of 2   04/11/2013     3:52:35 PM   CARE MANAGEMENT NOTE 04/11/2013  Patient:  Laurie Hunt, Laurie Hunt   Account Number:  [id number]  Date Initiated:  04/11/2013  Documentation initiated by:  Shaunee Mulkern  Subjective/Objective Assessment:   PT ADM ON 04/10/13 WITH DVT RT LEG.  PTA, PT INDEPENDENT OF ADLS.     Action/Plan:   CM REFERRAL FOR ASSISTANCE WITH MED COVERAGE.  (XARELTO VS. COUMADIN/LOVENOX)   Anticipated DC Date:  04/12/2013   Anticipated DC Plan:  HOME/SELF CARE      DC Planning Services  CM consult  Medication Assistance  PCP issues      Choice offered to / List presented to:             Status of service:  In process, will continue to follow Medicare Important Message given?   (If response is "NO", the following Medicare IM given date fields will be blank) Date Medicare IM given:   Date Additional Medicare IM given:    Discharge Disposition:    Per UR Regulation:  Reviewed for med. necessity/level of care/duration of stay  If discussed at Long Length of Stay Meetings, dates discussed:    Comments:  04/11/13 Preeti Winegardner,RN,BSN [phone] PT GIVEN INFORMATION AND # FOR [facility] PH[facility]ICIAN REFERRAL SERVICE, AS SHE HAS NO PCP.  STATES SHE WILL CALL AND ARRANGE PCP ASAP.  04/11/13 Frederik Standley,RN,BSN [phone] S/W GLORIA @ [facility]  # [phone] XARELTO  15 MG AND 20 MG  COVER - YES  CO -PAY  $ 45.00  T -3 DRUG PRIOR APPROVAL - YES  # [phone]  NOTIFIED PT AND MD THAT XARELTO COPAY IS $45; PT VERY INTERESTED IN XARELTO VS. LOVENOX/COUMADIN.  WILL NOTIFY PHARMACIST AS WELL.  PT WILL RECEIVE 30 DAY FREE TRIAL CARD OFFER.  WILL NEED EXTRA RX FOR 30 DAY SUPPLY OF XARELTO AT DC TO USE WITH TRIAL CARD.

## 2013-04-11 NOTE — Progress Notes (Signed)
FMTS Attending Note  I personally saw and evaluated the patient. The plan of care was discussed with the resident team. I agree with the assessment and plan as documented by the resident.   Agree with plan to set up anticoagulation as outpatient (suspect that patient would do better with Xarelto/Eliquis however unsure if she will be able to afford these), appreciate SW help with these issues  Donnella Sham MD

## 2013-04-11 NOTE — H&P (Signed)
Seen and examined.  Discussed with Dr. Gwenlyn Saran.  Agree with her management and documentation.  Briefly, 57 yo female with unilateral leg swelling found to have an extensive, acute DVT.  Admitted for anticoag.  She denies chest pain, SOB and we have no documented hypoxia, so no clinical evidence of PE.   Of note, she is disabled due to sig bipolar disease.  We will have to work carefully with her to make sure she can afford and comply with outpatient anticoag.  We will see what her insurance will cover.   Given her psych issues, I would not rush the process.

## 2013-04-11 NOTE — Progress Notes (Signed)
CSW received consult for patient needed a PCP upon dc. CSW passed this information on to Case Manager who will follow up with patient. CSW signing off. Please re consult if CSW needs arise.  Maree Krabbe, MSW, [facility] [phone]

## 2013-04-11 NOTE — Progress Notes (Signed)
IR asked to eval for consideration of thrombolysis therapy. RLE DVT noted on venous duplex. Pt has been on IV heparin since admission last night. She reports feeling significantly better today. She has had marked improvement in her edema as well. Excellent pulses bilaterally, minimally tender, trace -1+ edema of (R)LE when compared to left.  No relative contraindications for thrombolysis, but given significant improvement in her sxs and edema, would continue conservative therapy with heparin and conversion to oral anticoagulation of choice. Would monitor over weekend. If edema or symptoms significantly worsen, can reconsider thrombolysis therapy.  Patient seen by and above recs per Dr. Bonnielee Haff.  Brayton El PA-C Interventional Radiology 04/11/2013 1:14 PM

## 2013-04-11 NOTE — Progress Notes (Addendum)
Interim:  Discussed with patient and case management nurse the cost of Xarelto per month. Her co-pay will be about $45 and patient is agreeable to the cost and desiring to start Xarelto instead of warfarin or other anticoag agent. She states she is feeling great improvement in her symptoms since admission. Denies Shortness of breath or chest pain. Reports her swelling has improved in her leg.  IR was consulted and feels since she has had such great results with conservative treatment, that they recommended continuing with heparin--> oral anticoag.   O: BP 132/84  Pulse 89  Temp(Src) 97.6 F (36.4 C) (Oral)  Resp 16  Ht 5\' 5"  (1.651 m)  Wt 234 lb 12.6 oz (106.5 kg)  BMI 39.07 kg/m2  SpO2 92% Gen: alert. NAD. Pleasant Lungs: CTAB Ext: +2/4 pulses bilaterally, Mild TTP. trace  edema of RLE  A/P: Laurie Hunt is 57 y.o. female with unprovoked DVT in right LE with significant clot burden.  - Will start Xarelto per pharmacy today; stop heparin when appropriate - CM has worked with patient on cost of prescriptions. - Likely discharge patient home on Sunday if she continues to improve on Xarelto.   Felix Pacini DO FPTS PGY-2

## 2013-04-11 NOTE — Progress Notes (Signed)
Utilization Review Completed.Laurie Hunt T9/19/2014  

## 2013-04-12 LAB — BASIC METABOLIC PANEL
BUN: 12 mg/dL (ref 6–23)
Calcium: 8.7 mg/dL (ref 8.4–10.5)
Creatinine, Ser: 0.74 mg/dL (ref 0.50–1.10)
GFR calc Af Amer: >90 mL/min (ref >90)
GFR calc non Af Amer: >90 mL/min (ref >90)
Potassium: 3.6 mEq/L (ref 3.5–5.1)

## 2013-04-12 MED ORDER — RIVAROXABAN 20 MG PO TABS: 20.0000 mg | ORAL_TABLET | Freq: Every day | ORAL | Status: AC

## 2013-04-12 MED ORDER — RIVAROXABAN 15 MG PO TABS: 15.0000 mg | ORAL_TABLET | Freq: Two times a day (BID) | ORAL | Status: DC

## 2013-04-12 NOTE — Discharge Summary (Signed)
Family Medicine Teaching [facility] Discharge Summary  Patient name: Laurie Hunt Medical record number: [id number] Date of birth: 23-May-1956 Age: 57 y.o. Gender: female Date of Admission: 04/10/2013  Date of Discharge: 04/12/13 Admitting Physician: Uvaldo Rising, MD  Primary Care Provider: Pcp Not In System Consultants: IR  Indication for Hospitalization: DVT Rt leg  Discharge Diagnoses/Problem List:  DVT extensive Bipolar (Chronic)  Disposition: Home  Discharge Condition: Stable  Brief Hospital Course: Laurie Hunt is a 57 y.o. female who presented with right ankle/leg swelling for 4 days and was found to have extensive DVTs. Her vitals signs were stable and she was without leg pain, chest pain or SOB at admission. She was initially treated with heparin then transitioned to Xarelto for anticoagulation. Interventional Radiology was consulted and the decision was made not to thrombolysis as Laurie Hunt was clinically improving and her Rt leg continued to be well perfused with good distal pulses at discharge. Case management was consulted to help Laurie Hunt get established with a PCP in the community.   DVT: Extensive clot burden in right leg. No clear etiology at this time. No hx of clots, prolonged immobilization, smoking hx or hormone use. No symptoms of CA (But not up to date on colonoscopy and mammogram). Mild anemia Hgb 11.7 & mild leukopenia 4.4 on Admission Extensive hypercoag work up initiated. The following labs were wnl: lupus anticoag, APTT, beta 2 glycoprotin, factor 5 leiden, prothrombin gene mutation, protein c/s, and cadiolipin antibody. Her homocysteine was elevated at 212 and antithrombin III slightly elevated 127 (normal range 76-126). She was initially treated with heparin then switched to Xarelto 9/19. IR was consulted and the decision was made to continue Xarelto, monitor, and reconsider thrombolysis if symptoms worsen.   Bipolar / Anxiety: Last seen in behavioral health  02/26/13. Continued: Abilify, cogentin, topamax.   Issues for Follow Up:  1. Switch Xarelto to 20mg  Qd after 21 days of treatment (05/02/13) 2. Follow-up with anticoagulations labs, consider reordering positive labs with resolution of DVT 3. Consider TSH as thyroid abnormalities can elevate homocysteine levels  Significant Procedures: None  Significant Labs and Imaging:  Results for orders placed during the hospital encounter of 04/10/13 (from the past 72 hour(s))  COMPREHENSIVE METABOLIC PANEL     Status: Abnormal   Collection Time    04/10/13  7:20 PM      Result Value Range   Sodium 143  135 - 145 mEq/L   Potassium 3.5  3.5 - 5.1 mEq/L   Chloride 107  96 - 112 mEq/L   CO2 24  19 - 32 mEq/L   Glucose, Bld 156 (*) 70 - 99 mg/dL   BUN 8  6 - 23 mg/dL   Creatinine, Ser 4.78  0.50 - 1.10 mg/dL   Calcium 8.8  8.4 - 29.5 mg/dL   Total Protein 7.6  6.0 - 8.3 g/dL   Albumin 4.0  3.5 - 5.2 g/dL   AST 22  0 - 37 U/L   ALT 24  0 - 35 U/L   Alkaline Phosphatase 81  39 - 117 U/L   Total Bilirubin 0.5  0.3 - 1.2 mg/dL   GFR calc non Af Amer >90  >90 mL/min   GFR calc Af Amer >90  >90 mL/min   Comment: (NOTE)     The eGFR has been calculated using the CKD EPI equation.     This calculation has not been validated in all clinical situations.  eGFR's persistently <90 mL/min signify possible Chronic Kidney     Disease.  CBC     Status: Abnormal   Collection Time    04/10/13  7:20 PM      Result Value Range   WBC 4.7  4.0 - 10.5 K/uL   RBC 3.80 (*) 3.87 - 5.11 MIL/uL   Hemoglobin 11.9 (*) 12.0 - 15.0 g/dL   HCT 16.1 (*) 09.6 - 04.5 %   MCV 89.5  78.0 - 100.0 fL   MCH 31.3  26.0 - 34.0 pg   MCHC 35.0  30.0 - 36.0 g/dL   RDW 40.9  81.1 - 91.4 %   Platelets 203  150 - 400 K/uL  PROTIME-INR     Status: None   Collection Time    04/10/13  7:20 PM      Result Value Range   Prothrombin Time 13.7  11.6 - 15.2 seconds   INR 1.07  0.00 - 1.49  HEPARIN LEVEL (UNFRACTIONATED)     Status:  None   Collection Time    04/11/13  4:08 AM      Result Value Range   Heparin Unfractionated 0.61  0.30 - 0.70 IU/mL   Comment:            IF HEPARIN RESULTS ARE BELOW     EXPECTED VALUES, AND PATIENT     DOSAGE HAS BEEN CONFIRMED,     SUGGEST FOLLOW UP TESTING     OF ANTITHROMBIN III LEVELS.  BASIC METABOLIC PANEL     Status: Abnormal   Collection Time    04/11/13  4:08 AM      Result Value Range   Sodium 149 (*) 135 - 145 mEq/L   Potassium 3.6  3.5 - 5.1 mEq/L   Chloride 115 (*) 96 - 112 mEq/L   CO2 24  19 - 32 mEq/L   Glucose, Bld 101 (*) 70 - 99 mg/dL   BUN 8  6 - 23 mg/dL   Creatinine, Ser 7.82  0.50 - 1.10 mg/dL   Calcium 8.5  8.4 - 95.6 mg/dL   GFR calc non Af Amer >90  >90 mL/min   GFR calc Af Amer >90  >90 mL/min   Comment: (NOTE)     The eGFR has been calculated using the CKD EPI equation.     This calculation has not been validated in all clinical situations.     eGFR's persistently <90 mL/min signify possible Chronic Kidney     Disease.  HEPARIN LEVEL (UNFRACTIONATED)     Status: Abnormal   Collection Time    04/11/13 11:30 AM      Result Value Range   Heparin Unfractionated 0.84 (*) 0.30 - 0.70 IU/mL   Comment:            IF HEPARIN RESULTS ARE BELOW     EXPECTED VALUES, AND PATIENT     DOSAGE HAS BEEN CONFIRMED,     SUGGEST FOLLOW UP TESTING     OF ANTITHROMBIN III LEVELS.  GLUCOSE, CAPILLARY     Status: Abnormal   Collection Time    04/11/13  4:30 PM      Result Value Range   Glucose-Capillary 120 (*) 70 - 99 mg/dL   Comment 1 Documented in Chart     Comment 2 Notify RN    HEPARIN LEVEL (UNFRACTIONATED)     Status: Abnormal   Collection Time    04/11/13  7:08 PM      Result  Value Range   Heparin Unfractionated >2.20 (*) 0.30 - 0.70 IU/mL   Comment: RESULTS CONFIRMED BY MANUAL DILUTION                IF HEPARIN RESULTS ARE BELOW     EXPECTED VALUES, AND PATIENT     DOSAGE HAS BEEN CONFIRMED,     SUGGEST FOLLOW UP TESTING     OF ANTITHROMBIN III  LEVELS.  BASIC METABOLIC PANEL     Status: Abnormal   Collection Time    04/12/13  5:15 AM      Result Value Range   Sodium 140  135 - 145 mEq/L   Comment: DELTA CHECK NOTED   Potassium 3.6  3.5 - 5.1 mEq/L   Chloride 108  96 - 112 mEq/L   CO2 21  19 - 32 mEq/L   Glucose, Bld 101 (*) 70 - 99 mg/dL   BUN 12  6 - 23 mg/dL   Creatinine, Ser 8.65  0.50 - 1.10 mg/dL   Calcium 8.7  8.4 - 78.4 mg/dL   GFR calc non Af Amer >90  >90 mL/min   GFR calc Af Amer >90  >90 mL/min   Comment: (NOTE)     The eGFR has been calculated using the CKD EPI equation.     This calculation has not been validated in all clinical situations.     eGFR's persistently <90 mL/min signify possible Chronic Kidney     Disease.   RIGHT TIBIA AND FIBULA - 2 VIEW  Findings: No fracture or dislocation is noted. Joint spaces are  intact. No soft tissue abnormality is noted.   Right lower extremity venous duplex completed  Right lower extremity is positive for deep vein thrombosis involving the right saphenofemoral junction, right common femoral vein, right femoral vein, right popliteal vein, right posterior tibial vein, and right peroneal vein. There is no evidence of right Baker's cyst.   Results/Tests Pending at Time of Discharge: None  Discharge Medications:    Medication List         ARIPiprazole 30 MG tablet  Commonly known as:  ABILIFY  Take 30 mg by mouth daily.     benztropine 0.5 MG tablet  Commonly known as:  COGENTIN  Take 0.5 mg by mouth 2 (two) times daily.     multivitamin with minerals tablet  Take 1 tablet by mouth daily.     Rivaroxaban 15 MG Tabs tablet  Commonly known as:  XARELTO  Take 1 tablet (15 mg total) by mouth 2 (two) times daily with a meal. For the next 3 weeks, last dose 05/02/13     Rivaroxaban 20 MG Tabs tablet  Commonly known as:  XARELTO  Take 1 tablet (20 mg total) by mouth daily with supper. Starting 05/03/13  Start taking on:  05/03/2013     topiramate 100 MG  tablet  Commonly known as:  TOPAMAX  Take 100 mg by mouth 2 (two) times daily.       Discharge Instructions: Please refer to Patient Instructions section of EMR for full details.  Patient was counseled important signs and symptoms that should prompt return to medical care, changes in medications, dietary instructions, activity restrictions, and follow up appointments.   Follow-Up Appointments:   Follow-up Information   Follow up with Thayer Headings, MD On 04/14/2013.   Specialty:  Internal Medicine   Contact information:   [address] [place] [address] [phone]      Wenda Low, MD  04/12/2013, 9:18 AM PGY-1, [facility] Family [facility]

## 2013-04-12 NOTE — Progress Notes (Signed)
FMTS Attending Note  I personally saw and evaluated the patient. The plan of care was discussed with the resident team. I agree with the discharge summary as documented by the resident.   Hypercoagulable workup to be completed as outpatient.   Donnella Sham MD

## 2013-04-12 NOTE — Progress Notes (Signed)
Family Medicine Teaching Service Daily Progress Note Intern Pager: [phone]  Patient name: Laurie Hunt Medical record number: [id number] Date of birth: 1956-03-05 Age: 57 y.o. Gender: female  Primary Care Provider: Pcp Not In System Consultants: None Code Status: Full  Pt Overview and Major Events to Date:   9/18: Rt Leg DVT: Extensive clot burden 9/19: Started Xarelto  Assessment and Plan: Laurie Hunt is a 57 y.o. female presenting with calf tenderness and swelling for the past 4 days found to have DVT in vasc lab today. PMH is significant for mood disorder? Seems to be bipolar from previous note   #DVT: Extensive clot burden in right leg. No clear etiology at this time. No hx of clots, prolonged immobilization, smoking hx or hormone use. No symptoms of CA (But not up to date on colonoscopy and mammogram). Mild anemia Hgb 11.7 & mild leukopenia 4.4 on Admission  Workup: Extensive hypercoag work up initiated:  Wnl: lupus anticoag, APTT, beta2 glycoprotin, factor 5 leiden, prothrombin gene mutation, protein c/s,   Pending: cadiolipin antibody  PT/INR 13.7/1.07; Heparin level supratheraputic > 2.2  Homocysteine: (H) 212; antithrombin III slightly elevated 127 (normal range 76-126)  CMP: Na 149, Cl 115, Gluc 101 otherwise wnl on admission  Today wnl Na 140  Treatment  Heparin switched to Xarelto 9/19  IR c/s: Recs: Continue w/ oral anticoag, continue to monitor - reconsider thrombolysis if worsens  # Bipolar / Anxiety:  - Pt extremely anxious about getting her medications. Noted to be having shaking and lip trembling.  Last seen in behavioral health 02/26/13.  - Continue:  Abilify, cogentin, topamax.  # HCM- has not had colonoscopy. Last mammogram >1 year ago but was normal at that time. Plan-  - Recommend Colonoscopy as out patient  - Needs a PCP: Case management c/s: lack of PCP; Ms Laurie Hunt has number and said she will call today  FEN/GI: gen diet  Prophylaxis: on  heparin for DVT  Disposition: Home pending transition to oral anticoagulates for treatment of DVT  Subjective: Continue to deny pain in Rt leg or Chest pain/SOB, but she feels like the swelling has improved. She has been up walking around without pain or difficulty. Eating and drinking well  Objective: Temp:  [97.6 F (36.4 C)-99.1 F (37.3 C)] 99.1 F (37.3 C) (09/20 0449) Pulse Rate:  [75-89] 75 (09/20 0449) Resp:  [16-18] 18 (09/20 0449) BP: (109-149)/(69-84) 109/69 mmHg (09/20 0449) SpO2:  [92 %-100 %] 97 % (09/20 0449)  Physical Exam: Gen: WD/WN in NAD  CV: RRR, No m/r/g Lungs: CTAB; normal resp. effort LE: Rt leg continues to appears larger than left w trace edema to knee; no calf tenderness or pain with dorsiflexion; Pulses 2+ b/l; WWP  Laboratory: Results for orders placed during the hospital encounter of 04/10/13 (from the past 24 hour(s))  HEPARIN LEVEL (UNFRACTIONATED)     Status: Abnormal   Collection Time    04/11/13 11:30 AM      Result Value Range   Heparin Unfractionated 0.84 (*) 0.30 - 0.70 IU/mL  GLUCOSE, CAPILLARY     Status: Abnormal   Collection Time    04/11/13  4:30 PM      Result Value Range   Glucose-Capillary 120 (*) 70 - 99 mg/dL   Comment 1 Documented in Chart     Comment 2 Notify RN    HEPARIN LEVEL (UNFRACTIONATED)     Status: Abnormal   Collection Time    04/11/13  7:08 PM  Result Value Range   Heparin Unfractionated >2.20 (*) 0.30 - 0.70 IU/mL   Imaging/Diagnostic Tests:  RIGHT TIBIA AND FIBULA - 2 VIEW Findings: No fracture or dislocation is noted. Joint spaces are  intact. No soft tissue abnormality is noted.  Right lower extremity venous duplex completed Right lower extremity is positive for deep vein thrombosis involving the right saphenofemoral junction, right common femoral vein, right femoral vein, right popliteal vein, right posterior tibial vein, and right peroneal vein. There is no evidence of right Baker's cyst.  Wenda Low, MD 04/12/2013, 6:12 AM PGY-1, [facility] Family [facility] FPTS Intern pager: [phone], text pages welcome

## 2013-04-12 NOTE — Progress Notes (Signed)
   CARE MANAGEMENT NOTE 04/12/2013  Patient:  Laurie Hunt, Laurie Hunt   Account Number:  [id number]  Date Initiated:  04/11/2013  Documentation initiated by:  AMERSON,JULIE  Subjective/Objective Assessment:   PT ADM ON 04/10/13 WITH DVT RT LEG.  PTA, PT INDEPENDENT OF ADLS.     Action/Plan:   CM REFERRAL FOR ASSISTANCE WITH MED COVERAGE.  (XARELTO VS. COUMADIN/LOVENOX)   Anticipated DC Date:  04/12/2013   Anticipated DC Plan:  HOME/SELF CARE      DC Planning Services  CM consult  Medication Assistance  PCP issues      Choice offered to / List presented to:             Status of service:  Completed, signed off Medicare Important Message given?   (If response is "NO", the following Medicare IM given date fields will be blank) Date Medicare IM given:   Date Additional Medicare IM given:    Discharge Disposition:  HOME/SELF CARE  Per UR Regulation:  Reviewed for med. necessity/level of care/duration of stay  If discussed at Long Length of Stay Meetings, dates discussed:    Comments:  04/12/13 11:10 CM called into pt room to ensure she has her Xarelto free trial card.  Pt states she has it.  No other CM needs communicated.  Freddy Jaksch, BSN, [place] [phone]   04/11/13 JULIE AMERSON,RN,BSN [phone] PT GIVEN INFORMATION AND # FOR [facility] PHYSICIAN REFERRAL SERVICE, AS SHE HAS NO PCP.  STATES SHE WILL CALL AND ARRANGE PCP ASAP.  04/11/13 JULIE AMERSON,RN,BSN [phone] S/W GLORIA @ [facility]  # [phone] XARELTO  15 MG AND 20 MG  COVER - YES  CO -PAY  $ 45.00  T -3 DRUG PRIOR APPROVAL - YES  # [phone]  NOTIFIED PT AND MD THAT XARELTO COPAY IS $45; PT VERY INTERESTED IN XARELTO VS. LOVENOX/COUMADIN.  WILL NOTIFY PHARMACIST AS WELL.  PT WILL RECEIVE 30 DAY FREE TRIAL CARD OFFER.  WILL NEED EXTRA RX FOR 30 DAY SUPPLY OF XARELTO AT DC TO USE WITH TRIAL CARD.

## 2013-04-14 ENCOUNTER — Telehealth: Payer: Self-pay | Admitting: Family Medicine

## 2013-04-14 LAB — CARDIOLIPIN ANTIBODY: Phospholipids: 227 mg/dL (ref 151–264)

## 2013-04-14 NOTE — Telephone Encounter (Signed)
To MD. Fleeger, Jessica Dawn  

## 2013-04-14 NOTE — Telephone Encounter (Signed)
Pt was in the hospital this weekend. She was written a prescription for Xarelto by Dr Michail Jewels. [facility] on [address], [place] the pharmacist, says Dr Michail Jewels must contact the insurance company to authorize the Xarelto. Insurance is [employer]---[id number] Trinna Post the pharmacist [phone]

## 2013-04-15 NOTE — Telephone Encounter (Signed)
Called [employer]- per them pt was able to fill both rx for xarelto yesterday. auth # [id number], pt id # [id number] I will reverify with pharmacy that this was filled since it is of the utmost importance she start xarelto asap.

## 2013-04-15 NOTE — Discharge Summary (Signed)
I agree with the discharge summary as documented.   Mattingly Fountaine MD  

## 2013-04-16 ENCOUNTER — Telehealth: Payer: Self-pay | Admitting: Family Medicine

## 2013-04-16 NOTE — Telephone Encounter (Signed)
Thanks so much for letting me know Charlane Ferretti, MD

## 2013-04-16 NOTE — Telephone Encounter (Signed)
Laurie Hunt called because you wrote her prescription why she in the hospital. She wasn't sure if she was supposed to get refill from you or her PCP. I told her she can get them from her PCP she thought she was going to run out before then and then realized that she can get more on Monday 04/21/13. She just wanted you to know. JW

## 2013-04-22 ENCOUNTER — Telehealth: Payer: Self-pay | Admitting: Oncology

## 2013-04-22 NOTE — Telephone Encounter (Signed)
S/W PT IN REF TO NP APPT. ON 05/06/13@10 :30 REFERRING DR Thea Silversmith DX-DVT MAILED NP PACKET

## 2013-04-22 NOTE — Telephone Encounter (Signed)
C/D 04/22/13 for appt. 05/06/13

## 2013-04-29 ENCOUNTER — Telehealth: Payer: Self-pay | Admitting: Family Medicine

## 2013-04-29 NOTE — Telephone Encounter (Signed)
Will forward to MD. Jazmin Hartsell,CMA  

## 2013-04-29 NOTE — Telephone Encounter (Signed)
Spoke with this very pleasant woman about taking xarelto. Is about to switch from 15BID to 20 once daily. Have instructed her to take either at breakfast or bedtime whichever helps her remember better.  [facility], MD

## 2013-04-29 NOTE — Telephone Encounter (Signed)
Dr. Michail Jewels saw patient in the hospital and prescribed her Xarelto. Patient now has a few questions about the dosages of Xarelto. Please call patient asa soon as possible.

## 2013-05-06 ENCOUNTER — Ambulatory Visit (HOSPITAL_BASED_OUTPATIENT_CLINIC_OR_DEPARTMENT_OTHER): Payer: Medicare Other | Admitting: Oncology

## 2013-05-06 ENCOUNTER — Telehealth: Payer: Self-pay | Admitting: Oncology

## 2013-05-06 ENCOUNTER — Other Ambulatory Visit: Payer: Medicare Other | Admitting: Lab

## 2013-05-06 ENCOUNTER — Telehealth (HOSPITAL_COMMUNITY): Payer: Self-pay

## 2013-05-06 ENCOUNTER — Ambulatory Visit: Payer: Medicare Other

## 2013-05-06 ENCOUNTER — Encounter: Payer: Self-pay | Admitting: Oncology

## 2013-05-06 VITALS — BP 133/73 | HR 74 | Temp 97.8°F | Resp 20 | Ht 65.0 in | Wt 227.5 lb

## 2013-05-06 DIAGNOSIS — I82401 Acute embolism and thrombosis of unspecified deep veins of right lower extremity: Secondary | ICD-10-CM

## 2013-05-06 DIAGNOSIS — Z7901 Long term (current) use of anticoagulants: Secondary | ICD-10-CM

## 2013-05-06 DIAGNOSIS — D649 Anemia, unspecified: Secondary | ICD-10-CM

## 2013-05-06 DIAGNOSIS — R634 Abnormal weight loss: Secondary | ICD-10-CM

## 2013-05-06 DIAGNOSIS — I82409 Acute embolism and thrombosis of unspecified deep veins of unspecified lower extremity: Secondary | ICD-10-CM

## 2013-05-06 NOTE — Consult Note (Signed)
Reason for Referral: Deep vein thrombosis.   HPI: This is a 57 year old pleasant woman native of [place] and currently lives in [place]. She has a past medical history significant for bipolar disorder that have been reasonably controlled as of late. She was in her normal state of health still she presented on 04/10/2013 with a right leg and ankle swelling. She was evaluated in the emergency department and had a lower extremity Doppler and showed a acute deep vein thrombosis involving the SFJ , common femoral, femoral, popliteal, posterior tibial and peroneal veins of the right lower extremity. She was hospitalized briefly and started on intravenous heparin and was discharged home on Xarelto. Her hypercoagulable workup was obtained and showed really no abnormal findings. She was discharged home on Xarelto on 04/12/2013. And have being doing well on it. She was referred to me for the evaluation of this blood clot. She denies any provoking symptoms. She denies any recent travel history. She denied any trauma or orthopedic manipulation. She denied any new medication or any hormone supplements. She does not have any family history or personal history of any blood clots.  Clinically, she has recently reported weight loss of about 30 pounds but she reports that is intentional and she has been decreasing her sugar intake. She does report some abdominal fullness but no pain. Does not report any change in her bowel habits. Is not reporting any hematochezia or melena. She does not report any neurological symptoms no alteration of mental status. She does not report any fevers or chills or sweats. Is not reporting any genitourinary complaints. Does not report any frequency urgency or hesitancy. As that report any musculoskeletal complaints but recently reported some pain in her hands and feet since her discharge from the hospital. She has not reported any bleeding diathesis.   Past Medical History  Diagnosis  Date  . Anxiety   . Mental disorder     mood swings  . Peripheral vascular disease     DVT  :  No surgeries noted.  Current Outpatient Prescriptions  Medication Sig Dispense Refill  . ARIPiprazole (ABILIFY) 30 MG tablet Take 30 mg by mouth daily.      . benztropine (COGENTIN) 0.5 MG tablet Take 0.5 mg by mouth 2 (two) times daily.      . Multiple Vitamins-Minerals (MULTIVITAMIN WITH MINERALS) tablet Take 1 tablet by mouth daily.      . Rivaroxaban (XARELTO) 20 MG TABS tablet Take 1 tablet (20 mg total) by mouth daily with supper. Starting 05/03/13  90 tablet  0  . topiramate (TOPAMAX) 100 MG tablet Take 100 mg by mouth 2 (two) times daily.       No current facility-administered medications for this visit.         No Known Allergies:  No family history on file.:  History   Social History  . Marital Status: Married    Spouse Name: N/A    Number of Children: N/A  . Years of Education: N/A   Occupational History  . Not on file.   Social History Main Topics  . Smoking status: Never Smoker   . Smokeless tobacco: Not on file  . Alcohol Use: No  . Drug Use: No  . Sexual Activity: No   Other Topics Concern  . Not on file   Social History Narrative  . No narrative on file  :  Pertinent items are noted in HPI.  Exam: ECOG 1 Blood pressure 133/73, pulse 74, temperature 97.8 F (  36.6 C), temperature source Oral, resp. rate 20, height 5\' 5"  (1.651 m), weight 227 lb 8 oz (103.193 kg). General appearance: alert, cooperative and appears stated age Head: Normocephalic, without obvious abnormality, atraumatic Nose: Nares normal. Septum midline. Mucosa normal. No drainage or sinus tenderness. Throat: lips, mucosa, and tongue normal; teeth and gums normal Neck: no adenopathy, no carotid bruit, no JVD, supple, symmetrical, trachea midline and thyroid not enlarged, symmetric, no tenderness/mass/nodules Resp: clear to auscultation bilaterally Chest wall: no tenderness Cardio:  regular rate and rhythm, S1, S2 normal, no murmur, click, rub or gallop GI: soft, non-tender; bowel sounds normal; no masses,  no organomegaly Extremities: extremities normal, atraumatic, no cyanosis or edema Pulses: 2+ and symmetric       Assessment and Plan:   57 year old woman with the following issues:  1. Right lower extremity DVT diagnosed in September of 2014 without any clear-cut provoking symptoms. Her hypercoagulable workup obtained and doing her recent hospitalization and showed no abnormalities. She does not have any surgical intervention or prolonged immobilization to really explain her recent clot. The differential diagnosis discussed today with the patient in detail as well as the management options. This could due to unprovoked DVT and less likely related to a malignancy. She is not up to date on her cancer screening including a colonoscopy and a mammogram. She does have rather strange weight loss associated with abdominal pain that coupled with no recent cancer screening I suggest obtaining a CT scan of the abdomen and pelvis as well as images of the chest to rule out any alcohol malignancy. If that is all normal, then we are likely dealing with unprovoked DVT and she will need at least 6 months of anticoagulation with Xarelto. If she has any abnormalities in the CT scan we will deal with that at that time. I will see her back in 6 months to discuss further anticoagulation at that time.  2. Age-appropriate cancer screening: I have recommended that she gets a mammogram and a colonoscopy by her primary care physician.  3. Mild anemia: Her hemoglobin was around 11.9 which is rather mild and I think it is on related problem.

## 2013-05-06 NOTE — Progress Notes (Signed)
Please see consult note.  

## 2013-05-06 NOTE — Progress Notes (Signed)
Checked in new patient with no financial issues. I gave her the appt card. She is having problems with left hand and I advised her to let the dr know.

## 2013-05-06 NOTE — Telephone Encounter (Signed)
gv pt appt schedule for April 2015. [facility] will call w/ct scan - pt aware.

## 2013-05-07 ENCOUNTER — Other Ambulatory Visit (HOSPITAL_COMMUNITY): Payer: Self-pay | Admitting: Physician Assistant

## 2013-05-08 ENCOUNTER — Telehealth: Payer: Self-pay | Admitting: *Deleted

## 2013-05-08 ENCOUNTER — Telehealth: Payer: Self-pay

## 2013-05-08 NOTE — Telephone Encounter (Signed)
Patient saw Dr. Cleta Alberts and he had recently discovered that the patient had a blood clot and the patient had some questions about the medicationRivaroxaban (XARELTO) 20 MG TABS tablet  that he prescribed. She would not tell me what it was.she wants to speak to Dr. Cleta Alberts.  Please call patient. Thank you!  Best: [phone]

## 2013-05-08 NOTE — Telephone Encounter (Signed)
Spoke with patient re: CT scan scheduled for oct 22nd. Patient would like some more time to think about this. Does not want the scan now. She will call back when she is ready. Note to dr Clelia Croft.

## 2013-05-09 NOTE — Telephone Encounter (Signed)
Patient wants to see you again, you diagnosed her with a DVT, she was placed on Xarelto. Patient states her hands are numb and she has problems with her legs, she wants your opinion on this, Dr Ronne Binning saw her and placed her on nerve pills, she does not want to take nerve pills. I have provided your hours for her, and she will come in to see you. She is thankful you helped her with the DVT and would like to come back to you. I did advise her you can see her at the Urgent care, but you are not accepting new primary care patients, she understands.

## 2013-05-09 NOTE — Telephone Encounter (Signed)
Called patient. She indicates she will call me back.

## 2013-05-14 ENCOUNTER — Ambulatory Visit (HOSPITAL_COMMUNITY): Admission: RE | Admit: 2013-05-14 | Payer: Medicare Other | Source: Ambulatory Visit

## 2013-05-19 ENCOUNTER — Ambulatory Visit (INDEPENDENT_AMBULATORY_CARE_PROVIDER_SITE_OTHER): Payer: Medicare Other | Admitting: Family Medicine

## 2013-05-19 VITALS — BP 112/70 | HR 66 | Temp 98.6°F | Resp 16 | Ht 65.0 in | Wt 226.4 lb

## 2013-05-19 DIAGNOSIS — M25473 Effusion, unspecified ankle: Secondary | ICD-10-CM

## 2013-05-19 DIAGNOSIS — R200 Anesthesia of skin: Secondary | ICD-10-CM

## 2013-05-19 DIAGNOSIS — R209 Unspecified disturbances of skin sensation: Secondary | ICD-10-CM

## 2013-05-19 DIAGNOSIS — T887XXA Unspecified adverse effect of drug or medicament, initial encounter: Secondary | ICD-10-CM

## 2013-05-19 DIAGNOSIS — T50905A Adverse effect of unspecified drugs, medicaments and biological substances, initial encounter: Secondary | ICD-10-CM

## 2013-05-19 NOTE — Patient Instructions (Addendum)
Take 2 Gabapentin tonight.  Take 1 tomorrow night  Take 1 Wed night.   Take 1 pill Thursday night.   Stop taking Gabapentin on Friday   Let us know or your doctor know if your tingling gets worse.

## 2013-05-19 NOTE — Progress Notes (Signed)
Laurie Hunt is a 57 y.o. female who presents to Urgent Care today with complaints of tingling in fingers and leg "tightness."  1.  Tingling in hands:  Present for past 2-3 weeks.  Saw her PCP her started her on gabapentin.  Taking this 2 pills only at night (note that it is prescribed TID but she's not doing this).  Has noted increased fatigability in her hands when using them at times but no weakness that she notes.  Feels Gabapentin note really helped with tingling.  Noted this after starting Xarelto.  No neck pain, shoulder pain, arm tingling/pain.    2.  Leg tightness:  Began to notice this as worsneing after clot.  Noted that tightness started about 1 week ago, a few days after starting Gabapentin.  No swelling that she's noted in her legs.  Still taking Xarelto.  Wearing compression stockings.  Feels swelling Right leg is back to normal after DVT in September.    PMH reviewed.  Past Medical History  Diagnosis Date  . Anxiety   . Mental disorder     mood swings  . Peripheral vascular disease     DVT   History reviewed. No pertinent past surgical history.  Medications reviewed. Current Outpatient Prescriptions  Medication Sig Dispense Refill  . ABILIFY 30 MG tablet TAKE 1 TABLET (30 MG TOTAL) BY MOUTH DAILY.  30 tablet  5  . benztropine (COGENTIN) 0.5 MG tablet TAKE 1 TABLET (0.5 MG TOTAL) BY MOUTH 2 (TWO) TIMES DAILY.  60 tablet  5  . gabapentin (NEURONTIN) 100 MG capsule Take 200 mg by mouth 3 (three) times daily.      . Multiple Vitamins-Minerals (MULTIVITAMIN WITH MINERALS) tablet Take 1 tablet by mouth daily.      . Rivaroxaban (XARELTO) 20 MG TABS tablet Take 1 tablet (20 mg total) by mouth daily with supper. Starting 05/03/13  90 tablet  0  . topiramate (TOPAMAX) 100 MG tablet TAKE 1 TABLET (100 MG TOTAL) BY MOUTH 2 (TWO) TIMES DAILY.  60 tablet  2   No current facility-administered medications for this visit.    ROS as above otherwise neg.  No chest pain, palpitations,  SOB, Fever, Chills, Abd pain, N/V/D.   Physical Exam:  BP 112/70  Pulse 66  Temp(Src) 98.6 F (37 C) (Oral)  Resp 16  Ht 5\' 5"  (1.651 m)  Wt 226 lb 6.4 oz (102.694 kg)  BMI 37.67 kg/m2  SpO2 99% Gen:  Alert, cooperative patient who appears stated age in no acute distress.  Vital signs reviewed. HEENT: EOMI,  MMM Pulm:  Clear to auscultation bilaterally with good air movement.  No wheezes or rales noted.   Cardiac:  Regular rate and rhythm without murmur auscultated.  Good S1/S2. Exts: Trace edema BL ankles Neuro:  Sensation 5/5 BL upper and lower extremiteis.  Strength 5/5 BL upper and lower extremities.  No gross focal deficits noted.  Romberg negative  Assessment and Plan: 1.  Leg edema: - secondary most likely to Gabapentin usage - common side effect, and started after starting Gabapentin - trace and no red flags  2.  Tingling in fingers: - Unclear etiology - recommended to return to PCP or here if worsens after Gabapentin - no weakness, no red flags - otherwise no neuro deficits.  No Sensory deficits

## 2013-05-20 ENCOUNTER — Telehealth: Payer: Self-pay

## 2013-05-20 NOTE — Telephone Encounter (Signed)
Take 2 Gabapentin tonight.  Take 1 tomorrow night  Take 1 Wed night.  Take 1 pill Thursday night.  Stop taking Gabapentin on Friday  Let us know or your doctor know if your tingling gets worse.  Called pt, call not in service.

## 2013-05-20 NOTE — Telephone Encounter (Signed)
Patient states that she saw Dr. Gwendolyn Grant last night (05/19/2013) and he wrote down directions to ween her off of Gabapentin 100mg . Patient has lost that paper. Needs the information again.  [phone]

## 2013-05-23 NOTE — Telephone Encounter (Signed)
Called but number given not correct, called other number (home) and patient advised.

## 2013-05-28 ENCOUNTER — Other Ambulatory Visit (HOSPITAL_COMMUNITY): Payer: Self-pay | Admitting: Internal Medicine

## 2013-05-28 ENCOUNTER — Ambulatory Visit (HOSPITAL_COMMUNITY)
Admission: RE | Admit: 2013-05-28 | Discharge: 2013-05-28 | Disposition: A | Discharge status: Home / Self Care | Payer: Medicare Other | Source: Ambulatory Visit | Attending: Internal Medicine | Admitting: Internal Medicine

## 2013-05-28 DIAGNOSIS — Z86718 Personal history of other venous thrombosis and embolism: Secondary | ICD-10-CM | POA: Insufficient documentation

## 2013-05-28 DIAGNOSIS — M79609 Pain in unspecified limb: Secondary | ICD-10-CM

## 2013-05-28 DIAGNOSIS — M25561 Pain in right knee: Secondary | ICD-10-CM

## 2013-05-28 NOTE — Progress Notes (Signed)
*  PRELIMINARY RESULTS* Vascular Ultrasound Right lower extremity venous duplex has been completed.  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 No evidence of acute DVT.  Called report to on-call MD, Dr. Selena Batten.   Farrel Demark, RDMS, RVT  05/28/2013, 5:25 PM

## 2013-05-31 ENCOUNTER — Ambulatory Visit: Payer: Medicare Other

## 2013-05-31 ENCOUNTER — Ambulatory Visit (INDEPENDENT_AMBULATORY_CARE_PROVIDER_SITE_OTHER): Payer: Medicare Other | Admitting: Family Medicine

## 2013-05-31 VITALS — BP 120/70 | HR 73 | Temp 97.8°F | Resp 16 | Ht 65.0 in | Wt 227.0 lb

## 2013-05-31 DIAGNOSIS — R200 Anesthesia of skin: Secondary | ICD-10-CM

## 2013-05-31 DIAGNOSIS — M545 Low back pain, unspecified: Secondary | ICD-10-CM

## 2013-05-31 DIAGNOSIS — R209 Unspecified disturbances of skin sensation: Secondary | ICD-10-CM

## 2013-05-31 DIAGNOSIS — Z79899 Other long term (current) drug therapy: Secondary | ICD-10-CM

## 2013-05-31 DIAGNOSIS — R202 Paresthesia of skin: Secondary | ICD-10-CM

## 2013-05-31 LAB — POCT CBC
Granulocyte percent: 58.4 %G (ref 37–80)
MID (cbc): 0.2 (ref 0–0.9)
MPV: 8.5 fL (ref 0–99.8)
POC MID %: 4.5 %M (ref 0–12)
Platelet Count, POC: 306 10*3/uL (ref 142–424)
RBC: 4 M/uL — AB (ref 4.04–5.48)
RDW, POC: 15 %

## 2013-05-31 LAB — VITAMIN B12: Vitamin B-12: 270 pg/mL (ref 211–911)

## 2013-05-31 LAB — COMPREHENSIVE METABOLIC PANEL
ALT: 32 U/L (ref 0–35)
AST: 20 U/L (ref 0–37)
Alkaline Phosphatase: 64 U/L (ref 39–117)
Creat: 0.8 mg/dL (ref 0.50–1.10)
Total Bilirubin: 0.6 mg/dL (ref 0.3–1.2)

## 2013-05-31 NOTE — Progress Notes (Addendum)
Subjective:  This chart was scribed for [name], MD by [name], ED scribe.  This patient was seen in room [facility] Room 3 and the patient's care was started at 3:37 PM.   Patient ID: Laurie Hunt, female    DOB: 04-07-1956, 57 y.o.   MRN: [id number]  Chief Complaint  Patient presents with  . Back Pain    lower x 2 days  . Knee Pain    both knees with numbness x over a week    HPI  Laurie Hunt is a 57 y.o. female PCP: Thayer Headings, MD   Pt presents with over a week of numbness and tingling in bilateral knees and hands.  She was seen by Dr. Gwendolyn Grant 12 days ago for the same symptoms.  She had been started on gabapentin prior to this by her PCP.  Based on phone notes from last office visit, she began weaning off of gabapentin 8 days ago.  Her last full dose of gabapentin was 8 days ago and she states that her symptoms have not changed since then.  Numbness is localized to entire knees, tops of thighs, the front of her lower legs, and the tops of her feet.  It does not involve her buttocks, posterior legs, or the bottom of her feet.  On her hands the sensation is localized to the fingertips and occasionally to the top of the hands.  She denies numbness or tingling to any other areas including to her face.  She states she has had some difficulty walking due to discomfort but denies weakness in her legs.  Apart from weaning off of gabapentin pt denies any other recent medication changes or missed doses.  She notes that in the past Neurontin caused sedation without any relief of tingling.  Pt also complains of lower back pain that began last night.  She states that she was lying in bed when she turned onto her side and suddenly developed a sharp pain in her right lower back.  Her pain has persisted since then.  It does not radiate.  She denies bowel or bladder incontinence, numbness in the groin, or lower extremity weakness.  No treatment attempted pta.  Pt also complains of  bilateral knee swelling.  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  Takes Xarelto.  Diagnosed with DVT April 10 2013.      Patient Active Problem List   Diagnosis Date Noted  . Bipolar disorder, unspecified 02/07/2012   Past Medical History  Diagnosis Date  . Anxiety   . Mental disorder     mood swings  . Peripheral vascular disease     DVT   History reviewed. No pertinent past surgical history. No Known Allergies Prior to Admission medications   Medication Sig Start Date End Date Taking? Authorizing Provider  ABILIFY 30 MG tablet TAKE 1 TABLET (30 MG TOTAL) BY MOUTH DAILY. 05/07/13  Yes Jorje Guild, PA-C  benztropine (COGENTIN) 0.5 MG tablet TAKE 1 TABLET (0.5 MG TOTAL) BY MOUTH 2 (TWO) TIMES DAILY. 05/07/13  Yes Jorje Guild, PA-C  Multiple Vitamins-Minerals (MULTIVITAMIN WITH MINERALS) tablet Take 1 tablet by mouth daily.   Yes Historical Provider, MD  Rivaroxaban (XARELTO) 20 MG TABS tablet Take 1 tablet (20 mg total) by mouth daily with supper. Starting 05/03/13 05/03/13  Yes Anselm Lis, MD  topiramate (TOPAMAX) 100 MG tablet TAKE 1 TABLET (100 MG TOTAL) BY MOUTH 2 (TWO) TIMES DAILY.  05/07/13  Yes Jorje Guild, PA-C  gabapentin (NEURONTIN) 100 MG capsule Take 200 mg by mouth 3 (three) times daily.    Historical Provider, MD   History   Social History  . Marital Status: Married    Spouse Name: N/A    Number of Children: N/A  . Years of Education: N/A   Occupational History  . Not on file.   Social History Main Topics  . Smoking status: Never Smoker   . Smokeless tobacco: Not on file  . Alcohol Use: No  . Drug Use: No  . Sexual Activity: No   Other Topics Concern  . Not on file   Social History Narrative  . No narrative on file     Review of Systems  Respiratory: Negative for shortness of breath.   Cardiovascular: Negative for chest pain.  Gastrointestinal:       No bowel incontinence   Genitourinary:       No bladder incontinence  Musculoskeletal: Positive for back pain and joint swelling (bilateral knees).  Neurological: Positive for numbness. Negative for weakness.        Objective:   Physical Exam  Vitals reviewed. Constitutional: She is oriented to person, place, and time. She appears well-developed and well-nourished. No distress.  HENT:  Head: Normocephalic and atraumatic.  Right Ear: Hearing, tympanic membrane, external ear and ear canal normal.  Left Ear: Hearing, tympanic membrane, external ear and ear canal normal.  Nose: Nose normal.  Mouth/Throat: Oropharynx is clear and moist. No oropharyngeal exudate.  Eyes: Conjunctivae and EOM are normal. Pupils are equal, round, and reactive to light.  Cardiovascular: Normal rate, regular rhythm, normal heart sounds and intact distal pulses.  Exam reveals no friction rub.   No murmur heard. Pulses:      Dorsalis pedis pulses are 2+ on the right side, and 2+ on the left side.  Pulmonary/Chest: Effort normal and breath sounds normal. No respiratory distress. She has no wheezes. She has no rhonchi. She has no rales.  Musculoskeletal: She exhibits edema.  Trace pedal edema of lower extremities, right greater than left. Slight ttp of both calves, but no appreciable L calf swelling.  Slight tenderness to right paraspinals and SI joint on the right.  Negative straight leg raise. No appreciable knee ttp, ? Questionable effusion vs. Body habitus , equal bilaterally.   Neurological: She is alert and oriented to person, place, and time. She has normal strength and normal reflexes. A sensory deficit is present.  Reflex Scores:      Tricep reflexes are 2+ on the right side and 2+ on the left side.      Bicep reflexes are 2+ on the right side and 2+ on the left side.      Brachioradialis reflexes are 2+ on the right side and 2+ on the left side.      Patellar reflexes are 2+ on the right side and 2+ on the left side.       Achilles reflexes are 2+ on the right side and 2+ on the left side. Slight decreased sensation to dorsum of both feet, dorsum of lower legs, and dorsum of knees. Able to heel and toe walk. Babinski negative Upper extremity strength equal bilaterally, including grip strength. Neurovascularly intact distally to toes. Neurovascularly intact to fingertips.  Skin: Skin is warm and dry. No rash noted.  Psychiatric: She has a normal mood and affect. Her behavior is normal.    Filed Vitals:   05/31/13 1509  BP: 120/70  Pulse: 73  Temp: 97.8 F (36.6 C)  TempSrc: Oral  Resp: 16  Height: 5\' 5"  (1.651 m)  Weight: 227 lb (102.967 kg)  SpO2: 100%    [facility] reading (PRIMARY) by  Dr. Neva Seat: LS spine: NAD  Results for orders placed in visit on 05/31/13  POCT CBC      Result Value Range   WBC 4.4 (*) 4.6 - 10.2 K/uL   Lymph, poc 1.6  0.6 - 3.4   POC LYMPH PERCENT 37.1  10 - 50 %L   MID (cbc) 0.2  0 - 0.9   POC MID % 4.5  0 - 12 %M   POC Granulocyte 2.6  2 - 6.9   Granulocyte percent 58.4  37 - 80 %G   RBC 4.00 (*) 4.04 - 5.48 M/uL   Hemoglobin 12.5  12.2 - 16.2 g/dL   HCT, POC 16.1  09.6 - 47.9 %   MCV 100.6 (*) 80 - 97 fL   MCH, POC 31.3 (*) 27 - 31.2 pg   MCHC 31.1 (*) 31.8 - 35.4 g/dL   RDW, POC 04.5     Platelet Count, POC 306  142 - 424 K/uL   MPV 8.5  0 - 99.8 fL        Assessment & Plan:   Laurie Hunt is a 57 y.o. female Numbness and tingling - Plan: Comprehensive metabolic panel, POCT CBC, TSH, DG Lumbar Spine 2-3 Views, Vitamin B12  Low back pain - Plan: Comprehensive metabolic panel, POCT CBC, TSH, DG Lumbar Spine 2-3 Views  High risk medication use - Plan: Comprehensive metabolic panel, POCT CBC, TSH  LBP, strain paraspinal mm's. Sx care with heat,ice prn.  Tylenol, rom, and if not improving this week - rtc.   Dysesthesias - bilaterral hands/fingers, anterior upper/lower legs, and around knees. Underlying R DVT persistent on doppler 3 days ago, but no  progression. Did not appreciate significant LE edema to explain sx's and exam otherwise reassuring. DDX includes med side effect, but unchanged with stopping neurontin (did not improve when on this either, and sedation with neurontin). Will check TSH, CBC, B12, and CMP with underlying high risk meds. Discussed trial of Neurontin 300mg  QHS, or Lyrica at bedtime, and referral to neurology, but she declined these options. She would like to discuss the medication with her psychiatric provider first.  She will call [facility]  to discuss Topamax as this may be contributing. rtc precautions discussed.    No orders of the defined types were placed in this encounter.   Patient Instructions  Call your psychiatric provider to discuss your medications and the tingling you are having in your hands and legs. I recommend an evaluation with a neurologist, so please let me know if you change your mind and would like to see one and I will refer you. You should receive a call or letter about your lab results within the next week to 10 days.  Tylenol, heat, gentle stretches and range of motion for your back. Recheck this if not improving in next week.  Return to the clinic or go to the nearest emergency room if any of your symptoms worsen or new symptoms occur.   I personally performed the services described in this documentation, which was scribed in my presence. The recorded information has been reviewed and considered, and addended by me as needed.

## 2013-05-31 NOTE — Patient Instructions (Signed)
Call your psychiatric provider to discuss your medications and the tingling you are having in your hands and legs. I recommend an evaluation with a neurologist, so please let me know if you change your mind and would like to see one and I will refer you. You should receive a call or letter about your lab results within the next week to 10 days.  Tylenol, heat, gentle stretches and range of motion for your back. Recheck this if not improving in next week.  Return to the clinic or go to the nearest emergency room if any of your symptoms worsen or new symptoms occur.

## 2013-06-02 ENCOUNTER — Telehealth (HOSPITAL_COMMUNITY): Payer: Self-pay | Admitting: *Deleted

## 2013-06-02 NOTE — Telephone Encounter (Signed)
Pt states she has numbness, tingling and pain through out her entire body. States her primary MD thought it might be a side effect of her Topamax, but she states, I've been on tTopamax for a long time and there has not been any problem. Pt states she is in so much discomfort, she cannot find a comfortable place to sit or lie down.  States she recently had a blood clot in her leg and was started on Xarelto for that. Also states one MD tried to help these symptoms in past with Gabapentin, but that she had to stop due to side effects.  Pt is crying while speaking, stating someone needs to do something and she does not know what to do since her provider in this office has left. Advised pt that this information will be given to Dr.Plovsky [pt has appt with him 09/03/13] with her phone number.  Also advised pt to contact primary MD as this may be sign of a medical problem. Message called to Dr. Donell Beers @ [phone]

## 2013-06-03 ENCOUNTER — Other Ambulatory Visit: Payer: Self-pay

## 2013-06-03 ENCOUNTER — Encounter (HOSPITAL_COMMUNITY): Payer: Self-pay | Admitting: Emergency Medicine

## 2013-06-03 ENCOUNTER — Emergency Department (HOSPITAL_COMMUNITY)
Admission: EM | Admit: 2013-06-03 | Discharge: 2013-06-03 | Disposition: A | Discharge status: Home / Self Care | Payer: Medicare Other | Attending: Emergency Medicine | Admitting: Emergency Medicine

## 2013-06-03 ENCOUNTER — Telehealth (HOSPITAL_COMMUNITY): Payer: Self-pay | Admitting: *Deleted

## 2013-06-03 DIAGNOSIS — F319 Bipolar disorder, unspecified: Secondary | ICD-10-CM | POA: Insufficient documentation

## 2013-06-03 DIAGNOSIS — I82401 Acute embolism and thrombosis of unspecified deep veins of right lower extremity: Secondary | ICD-10-CM

## 2013-06-03 DIAGNOSIS — F411 Generalized anxiety disorder: Secondary | ICD-10-CM | POA: Insufficient documentation

## 2013-06-03 DIAGNOSIS — Z7901 Long term (current) use of anticoagulants: Secondary | ICD-10-CM | POA: Insufficient documentation

## 2013-06-03 DIAGNOSIS — M549 Dorsalgia, unspecified: Secondary | ICD-10-CM | POA: Insufficient documentation

## 2013-06-03 DIAGNOSIS — I82409 Acute embolism and thrombosis of unspecified deep veins of unspecified lower extremity: Secondary | ICD-10-CM | POA: Insufficient documentation

## 2013-06-03 DIAGNOSIS — M7989 Other specified soft tissue disorders: Secondary | ICD-10-CM

## 2013-06-03 DIAGNOSIS — M79604 Pain in right leg: Secondary | ICD-10-CM

## 2013-06-03 DIAGNOSIS — Z79899 Other long term (current) drug therapy: Secondary | ICD-10-CM | POA: Insufficient documentation

## 2013-06-03 HISTORY — DX: Acute embolism and thrombosis of unspecified deep veins of unspecified lower extremity: I82.409

## 2013-06-03 NOTE — ED Notes (Signed)
Pt c/o pain and edema in BLE, and feet and ankles, Pitting edema 1+, pt takes xalerto, for previous blood clot in legs, Pt c/o pain in lower back 10/10

## 2013-06-03 NOTE — ED Provider Notes (Signed)
CSN: [id number]     Arrival date & time 06/03/13  1218 History   First MD Initiated Contact with Patient 06/03/13 1724     Chief Complaint  Patient presents with  . Leg Swelling  . Back Pain   (Consider location/radiation/quality/duration/timing/severity/associated sxs/prior Treatment) HPI Comments: 57 yo female with bipolar hx and right dvt, on xarelto presents with right leg swelling/ pain since diagnosis.  Pt has tried nerve medicines, no improvement.  Pain worse at night.  Burning/ tingling.  Mild right back pain.  No injuries.  No fevers, IVDU, DM, surgeries or b/b changes.  Similar to previous.   Patient is a 57 y.o. female presenting with back pain. The history is provided by the patient.  Back Pain Associated symptoms: no abdominal pain, no chest pain, no dysuria, no fever and no headaches     Past Medical History  Diagnosis Date  . Anxiety   . Mental disorder     mood swings  . Peripheral vascular disease     DVT  . DVT (deep venous thrombosis)    History reviewed. No pertinent past surgical history. No family history on file. History  Substance Use Topics  . Smoking status: Never Smoker   . Smokeless tobacco: Not on file  . Alcohol Use: No   OB History   Grav Para Term Preterm Abortions TAB SAB Ect Mult Living                 Review of Systems  Constitutional: Negative for fever and chills.  HENT: Negative for congestion.   Eyes: Negative for visual disturbance.  Respiratory: Negative for shortness of breath.   Cardiovascular: Positive for leg swelling. Negative for chest pain.  Gastrointestinal: Negative for vomiting and abdominal pain.  Genitourinary: Negative for dysuria and flank pain.  Musculoskeletal: Positive for arthralgias and back pain. Negative for neck pain and neck stiffness.  Skin: Negative for rash.  Neurological: Negative for light-headedness and headaches.    Allergies  Gabapentin  Home Medications   Current Outpatient Rx  Name   Route  Sig  Dispense  Refill  . ABILIFY 30 MG tablet      TAKE 1 TABLET (30 MG TOTAL) BY MOUTH DAILY.   30 tablet   5   . benztropine (COGENTIN) 0.5 MG tablet      TAKE 1 TABLET (0.5 MG TOTAL) BY MOUTH 2 (TWO) TIMES DAILY.   60 tablet   5   . gabapentin (NEURONTIN) 100 MG capsule   Oral   Take 200 mg by mouth 3 (three) times daily.         . Multiple Vitamins-Minerals (MULTIVITAMIN WITH MINERALS) tablet   Oral   Take 1 tablet by mouth daily.         . Rivaroxaban (XARELTO) 20 MG TABS tablet   Oral   Take 1 tablet (20 mg total) by mouth daily with supper. Starting 05/03/13   90 tablet   0   . topiramate (TOPAMAX) 100 MG tablet      TAKE 1 TABLET (100 MG TOTAL) BY MOUTH 2 (TWO) TIMES DAILY.   60 tablet   2    BP 128/70  Pulse 89  Temp(Src) 98.3 F (36.8 C) (Oral)  Resp 22  Wt 225 lb 1 oz (102.088 kg)  SpO2 100% Physical Exam  Nursing note and vitals reviewed. Constitutional: She is oriented to person, place, and time. She appears well-developed and well-nourished.  HENT:  Head: Normocephalic and atraumatic.  Eyes: Conjunctivae are normal. Right eye exhibits no discharge. Left eye exhibits no discharge.  Neck: Normal range of motion. Neck supple. No tracheal deviation present.  Cardiovascular: Normal rate and regular rhythm.   Pulmonary/Chest: Effort normal and breath sounds normal.  Abdominal: Soft.  Musculoskeletal: She exhibits edema and tenderness.  Neurological: She is alert and oriented to person, place, and time. She has normal strength. No sensory deficit.  Reflex Scores:      Patellar reflexes are 2+ on the right side and 2+ on the left side. Skin: Skin is warm. No rash noted.  Mild swelling right lower ext, soft compartment, nv intact, good pulses LE Mild right paraspinal tenderness, no midline pain  Psychiatric: She has a normal mood and affect.    ED Course  Procedures (including critical care time) Labs Review Labs Reviewed - No data to  display Imaging Review No results found.  EKG Interpretation   None       MDM   1. Leg swelling   2. DVT (deep venous thrombosis), right   3. Right leg pain    Chronic leg pain, likely combination of clot/ peripheral nerve. No sob or cp, well appearing. Son with pt.  Offered pain meds, discussed close fup outpt. Pt will fup for repeat US in one month to look for resolution of clot.  Results and differential diagnosis were discussed with the patient. Close follow up outpatient was discussed, patient comfortable with the plan.   Diagnosis: DVT right leg, Right leg pain    Enid Skeens, MD 06/03/13 [phone]

## 2013-06-03 NOTE — Telephone Encounter (Signed)
VM left 1038: VM recv'd 1403:Pain in legs worse than yesterday.Needs advice on whether to go to hospital or not.Please call. Attempted to contact pt at [phone], no answer. Per D.Overbey,Sec II at front desk, was not aware pt had contacted this Clinical research associate, patient had also called front desk with same concern and was advised to contact her primary MD and/or go to ED if pain severe.

## 2013-06-03 NOTE — ED Notes (Signed)
Pt came in via ems for bilateral LE swelling and worse in Right leg.  Pulses present bilaterally.  Pt complains of lower back pain.  Pt reports taking longer for her to urinate

## 2013-06-04 ENCOUNTER — Ambulatory Visit (INDEPENDENT_AMBULATORY_CARE_PROVIDER_SITE_OTHER): Payer: Medicare Other | Admitting: Psychiatry

## 2013-06-04 DIAGNOSIS — F3130 Bipolar disorder, current episode depressed, mild or moderate severity, unspecified: Secondary | ICD-10-CM

## 2013-06-04 MED ORDER — HYDROXYZINE HCL 25 MG PO TABS: ORAL_TABLET | ORAL | Status: AC

## 2013-06-04 NOTE — Progress Notes (Signed)
[facility] MD Progress Note  06/04/2013 3:24 PM Laurie Hunt  MRN:  [id number] Subjective:  My leg hurts and I'm nervous This patient is a brand-new patient for me. She's been working because she has pain in her right day and refuses to wait for a scheduled appointment. The patient clearly appears quite anxious about this pain in her right leg. I verified with her that she had a blood clot and was on a blood thinner and that some pain in her right leg is probably related to resolving blood clot. She said her right leg was very swollen but now it is less swollen albeit still swollen. The patient denies any chest pain. She denies any shortness of breath. She describes a little bit of tingling in her fingers. She now is this tingling occurred when she started on a new medication from one of her internist. The patient denies hearing voices at this time. She denies persistent depression. She does acknowledge that she is anxious and frightened about her right leg. She was seen in the emergency room for it. The patient was told to take Tylenol but has not been compliant. She said she took it one time. Today we redirected her in that this is a physical complaints. I suggested to her that she call her primary care Dr. Dr. Dorma Russell about her pain complaints. The patient agreed to do this. I shared with her that or give her something small to help her sleep she was complaining that she's not sleeping. She also describes obviously feeling very anxious about her medical status. Diagnosis:   DSM5: Schizophrenia Disorders:   Obsessive-Compulsive Disorders:   Trauma-Stressor Disorders:   Substance/Addictive Disorders:   Depressive Disorders:    Axis I: Bipolar, Depressed  ADL's:  Intact  Sleep: Poor  Appetite:  Fair  Suicidal Ideation:  no Homicidal Ideation:  no AEB (as evidenced by):  Psychiatric Specialty Exam: ROS  There were no vitals taken for this visit.There is no weight on file to calculate  BMI.  General Appearance: Casual  Eye Contact::  Good  Speech:  Normal Rate  Volume:  Normal  Mood:  Anxious  Affect:  Appropriate  Thought Process:  Coherent  Orientation:  Full (Time, Place, and Person)  Thought Content:  WDL  Suicidal Thoughts:  No  Homicidal Thoughts:  No  Memory:    Judgement:  Impaired  Insight:  Lacking  Psychomotor Activity:  Normal  Concentration:  Good  Recall:  Fair  Akathisia:  No  Handed:  Right  AIMS (if indicated):     Assets:  Desire for Improvement  Sleep:      Current Medications: Current Outpatient Prescriptions  Medication Sig Dispense Refill  . ABILIFY 30 MG tablet TAKE 1 TABLET (30 MG TOTAL) BY MOUTH DAILY.  30 tablet  5  . benztropine (COGENTIN) 0.5 MG tablet TAKE 1 TABLET (0.5 MG TOTAL) BY MOUTH 2 (TWO) TIMES DAILY.  60 tablet  5  . gabapentin (NEURONTIN) 100 MG capsule Take 200 mg by mouth 3 (three) times daily.      . hydrOXYzine (ATARAX/VISTARIL) 25 MG tablet 1 qam  2  qhs  90 tablet  3  . Multiple Vitamins-Minerals (MULTIVITAMIN WITH MINERALS) tablet Take 1 tablet by mouth daily.      . Rivaroxaban (XARELTO) 20 MG TABS tablet Take 1 tablet (20 mg total) by mouth daily with supper. Starting 05/03/13  90 tablet  0  . topiramate (TOPAMAX) 100 MG tablet TAKE 1 TABLET (  100 MG TOTAL) BY MOUTH 2 (TWO) TIMES DAILY.  60 tablet  2   No current facility-administered medications for this visit.    Lab Results: No results found for this or any previous visit (from the past 48 hour(s)).  Physical Findings: AIMS:  , ,  ,  ,    CIWA:    COWS:     Treatment Plan Summary: At this time I asked her to continue taking her Abilify and Cogentin. She'll continue on her Topamax at this present time but ultimately our reevaluate the use of this medication. Today's appointment was in urgent visit of which the patient came 20 minutes late for a 30 minute visit. The patient does not appear suicidal. The patient does appear anxious and uncomfortable. My  interventions were to support that she take Tylenol extra strength 2 twice a day and that she begin on Vistaril 25 mg one in the morning and 2 at night to help her be less anxious and help her sleep at night. This patient to return to see me in her routine visit and a complete evaluation in the next 2-3 months.  Plan:  Medical Decision Making Problem Points:  Established problem, worsening (2) Data Points:  Review of medication regiment & side effects (2)  I certify that inpatient services furnished can reasonably be expected to improve the patient's condition.   Laurie  Hunt 06/04/2013, 3:24 PM

## 2013-06-05 ENCOUNTER — Ambulatory Visit (HOSPITAL_COMMUNITY): Payer: Self-pay | Admitting: Psychiatry

## 2013-06-06 ENCOUNTER — Telehealth: Payer: Self-pay | Admitting: Oncology

## 2013-06-06 NOTE — Telephone Encounter (Signed)
RECEIVED REFERRAL,GAVE TO DR Asante [facility] NURSE

## 2013-06-07 ENCOUNTER — Telehealth: Payer: Self-pay

## 2013-06-07 NOTE — Telephone Encounter (Signed)
Patient was seen by Dr Cleta Alberts in September. Says she needs a pain rx sent to her pharmacy. Says she has pain in her legs and lower back. Uses [facility] on [address]. Cb# [phone]

## 2013-06-08 ENCOUNTER — Encounter: Payer: Self-pay | Admitting: Family Medicine

## 2013-06-08 NOTE — Telephone Encounter (Signed)
Patient would need to come to the office for reevaluation. The last time I saw her with leg pain she had a large clot and had to be admitted to the hospital. We cannot just call in : Pain medication for her. she needs to be checked either here or at the emergency room .

## 2013-06-08 NOTE — Telephone Encounter (Signed)
Patient notified and voiced understanding.

## 2013-06-08 NOTE — Telephone Encounter (Signed)
Spoke with patient and she states she is having pain in both ankles, both knees, and lower back. She has been taking tylenol but no relief. Please advise

## 2013-06-13 ENCOUNTER — Other Ambulatory Visit: Payer: Self-pay | Admitting: Oncology

## 2013-06-16 ENCOUNTER — Telehealth: Payer: Self-pay | Admitting: Oncology

## 2013-06-16 NOTE — Telephone Encounter (Signed)
lvm for pt regarding to 11.25.14 appt.Marland KitchenMarland KitchenMarland Kitchen

## 2013-06-17 ENCOUNTER — Ambulatory Visit: Payer: Self-pay | Admitting: Oncology

## 2013-08-15 ENCOUNTER — Ambulatory Visit (HOSPITAL_COMMUNITY): Payer: Self-pay | Admitting: Psychiatry

## 2013-09-01 ENCOUNTER — Ambulatory Visit (HOSPITAL_COMMUNITY): Payer: Self-pay | Admitting: Physician Assistant

## 2013-09-03 ENCOUNTER — Ambulatory Visit (HOSPITAL_COMMUNITY): Payer: Self-pay | Admitting: Psychiatry

## 2013-10-23 ENCOUNTER — Telehealth: Payer: Self-pay | Admitting: Oncology

## 2013-10-23 NOTE — Telephone Encounter (Signed)
per FS resch pt for 5/5. Cld pt & left message of new time & date

## 2013-11-04 ENCOUNTER — Ambulatory Visit: Payer: Self-pay | Admitting: Oncology

## 2013-11-25 ENCOUNTER — Ambulatory Visit: Payer: Self-pay | Admitting: Oncology

## 2014-05-14 IMAGING — CR DG LUMBAR SPINE 2-3V
3 series · 3 of 3 positions shown · non-contrast
Comparison: 07/23/2012

CLINICAL DATA: Back pain for 2 days

EXAM:
LUMBAR SPINE - 2-3 VIEW

[AP]
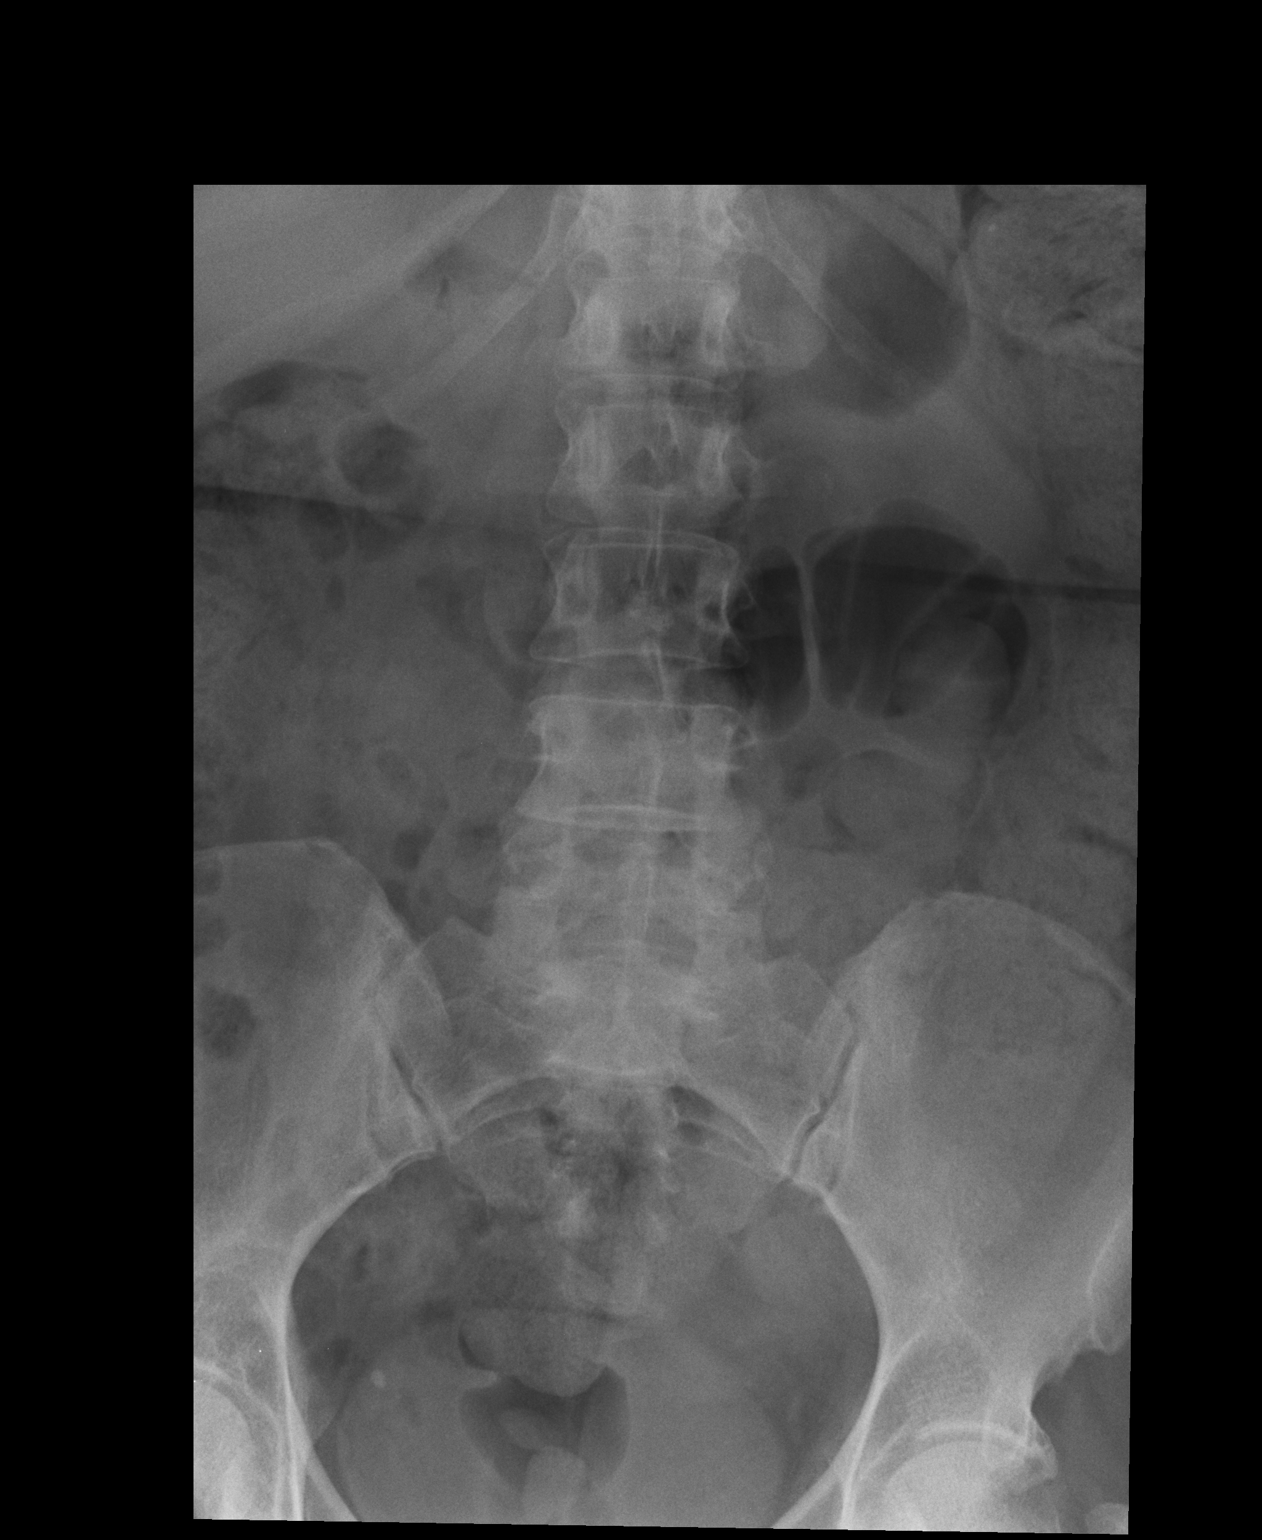

[lateral]
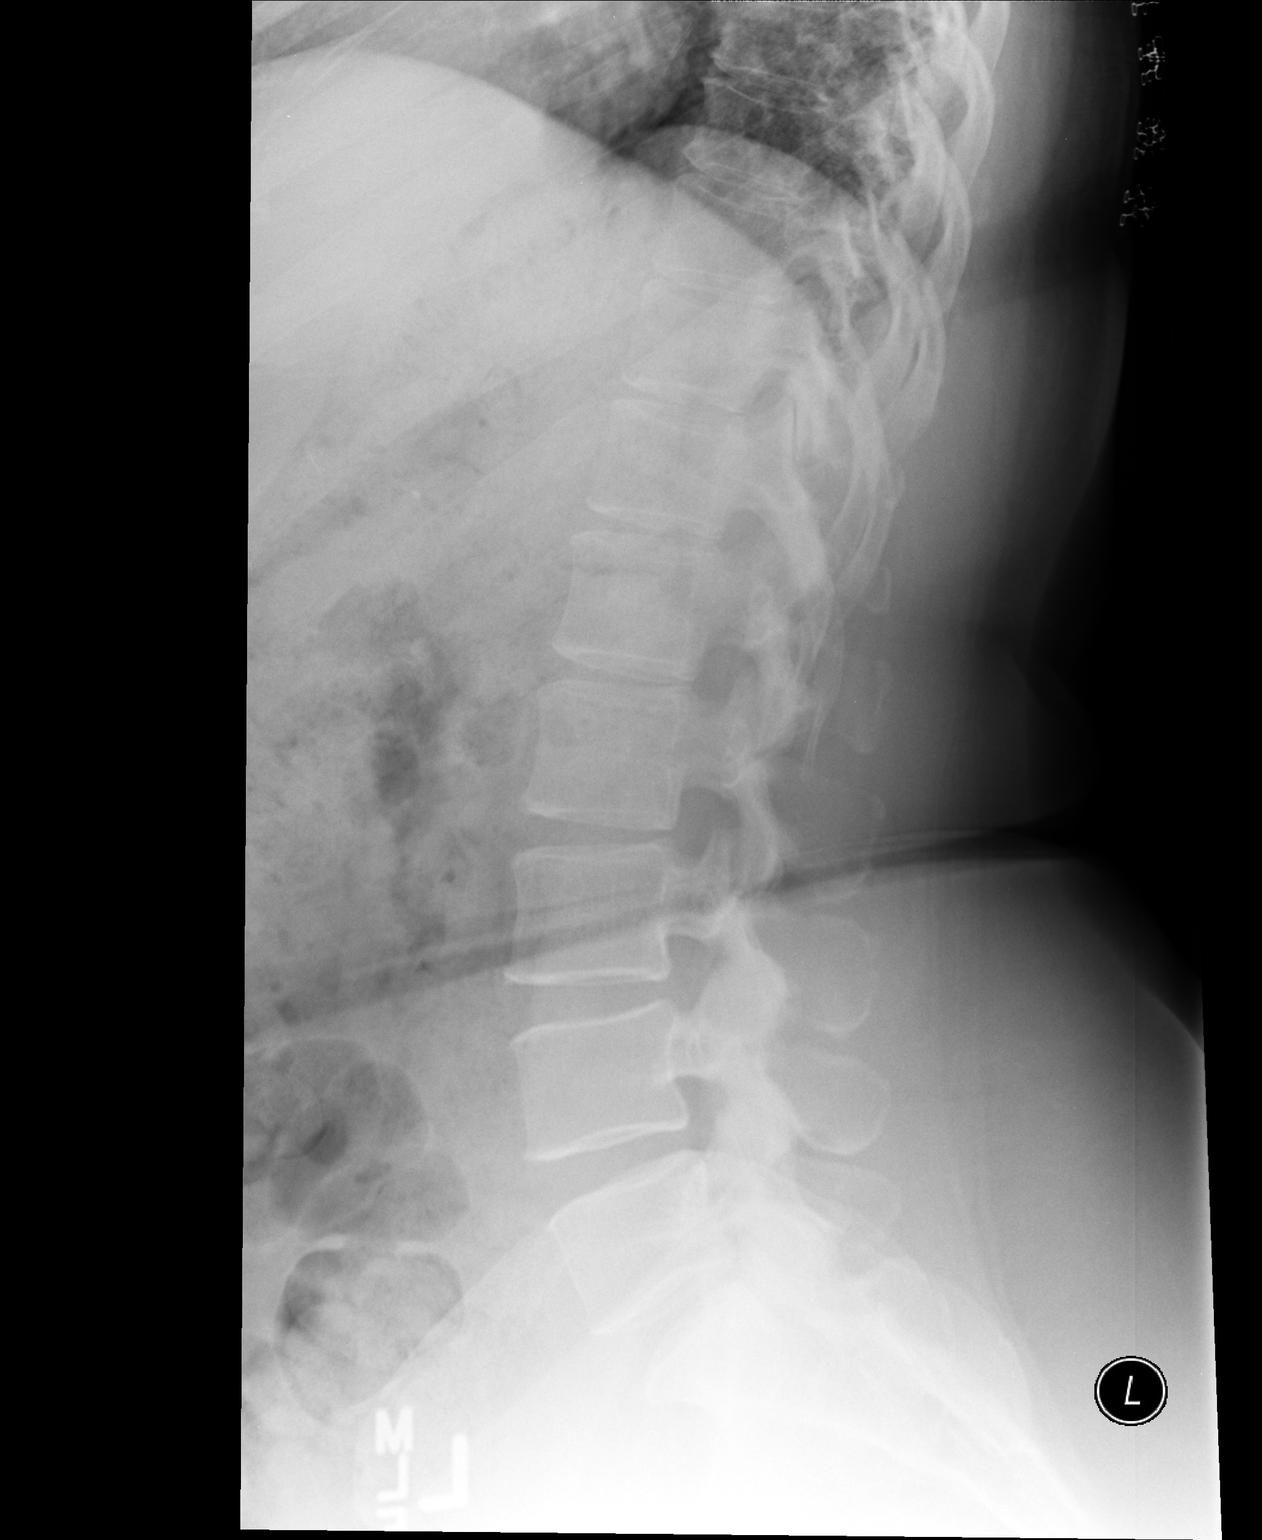

[l5 s1]
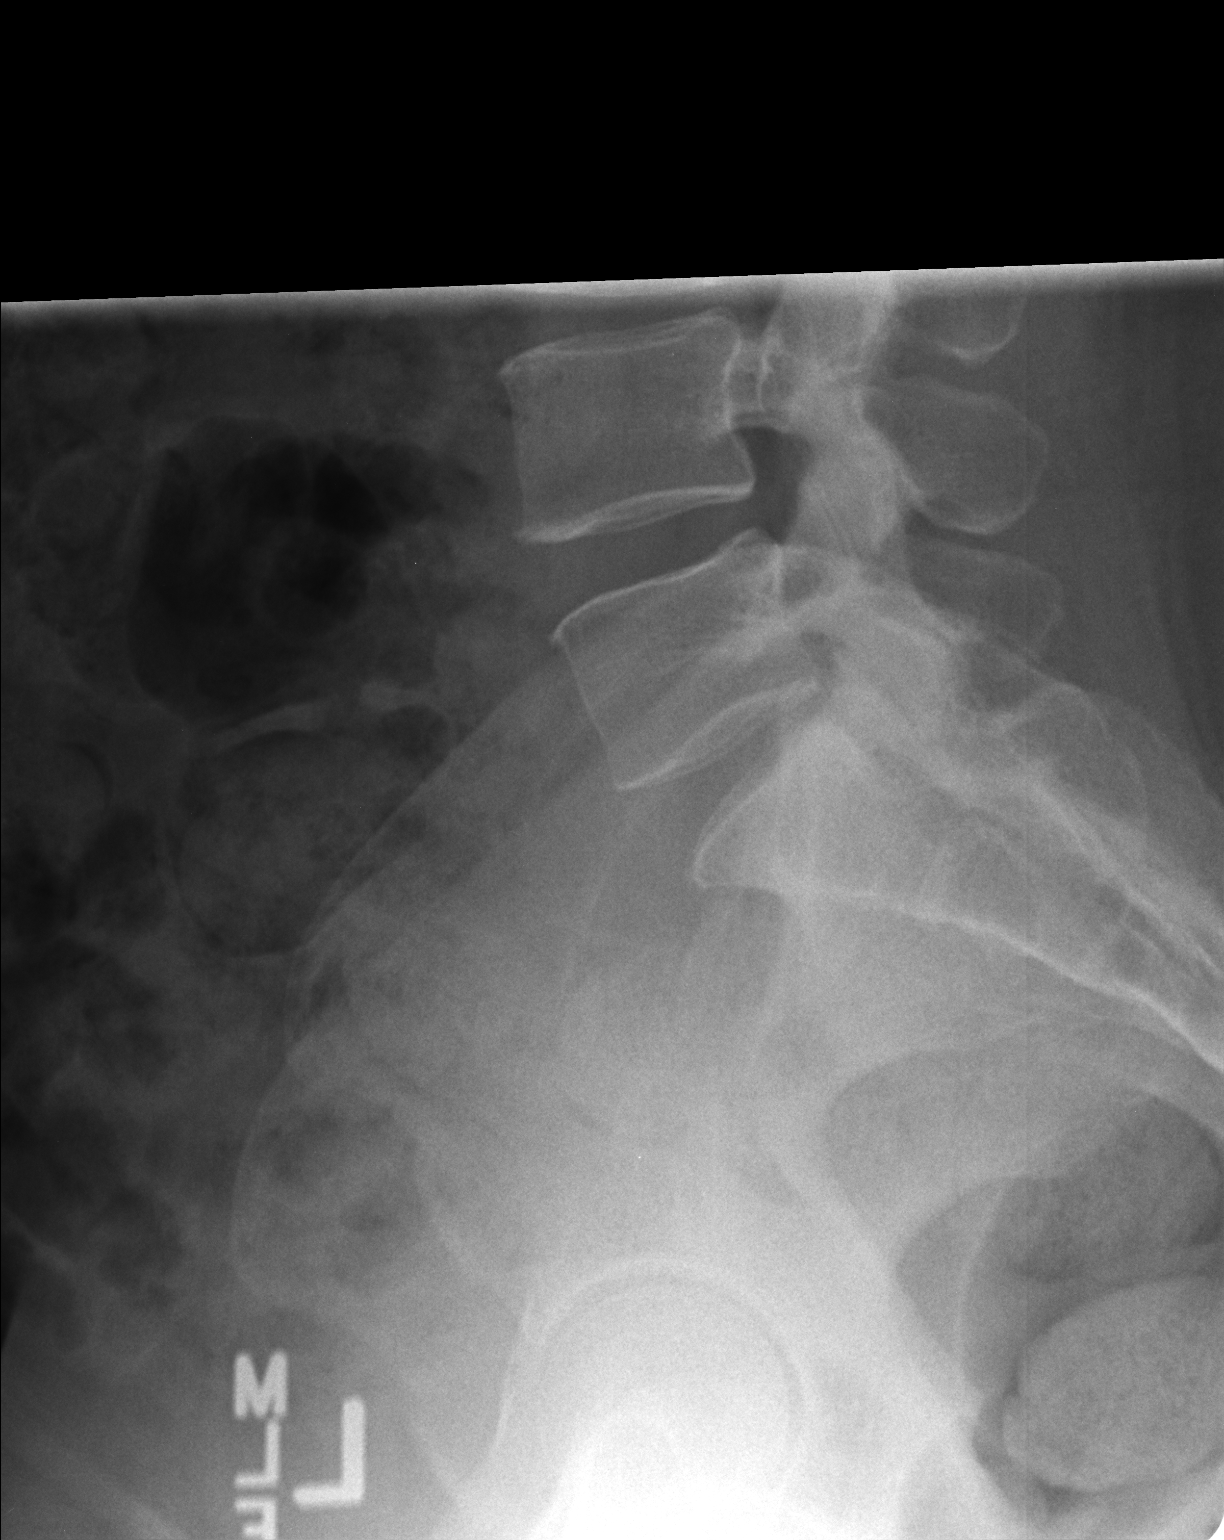

[3 of 3 positions shown; findings below may reference images not displayed]

FINDINGS: Three views of lumbar spine submitted. No acute fracture or
subluxation. Mild disc space flattening at L5-S1 level. Mild facet
degenerative changes L5 level.
IMPRESSION: No acute fracture or subluxation. Mild degenerative changes.

## 2014-07-24 DIAGNOSIS — M24562 Contracture, left knee: Secondary | ICD-10-CM | POA: Diagnosis not present

## 2014-07-24 DIAGNOSIS — M24572 Contracture, left ankle: Secondary | ICD-10-CM | POA: Diagnosis not present

## 2014-07-24 DIAGNOSIS — M24571 Contracture, right ankle: Secondary | ICD-10-CM | POA: Diagnosis not present

## 2014-07-24 DIAGNOSIS — G40909 Epilepsy, unspecified, not intractable, without status epilepticus: Secondary | ICD-10-CM | POA: Diagnosis not present

## 2014-07-24 DIAGNOSIS — R1311 Dysphagia, oral phase: Secondary | ICD-10-CM | POA: Diagnosis not present

## 2014-07-24 DIAGNOSIS — L98499 Non-pressure chronic ulcer of skin of other sites with unspecified severity: Secondary | ICD-10-CM | POA: Diagnosis not present

## 2014-07-24 DIAGNOSIS — M24561 Contracture, right knee: Secondary | ICD-10-CM | POA: Diagnosis not present

## 2014-07-24 DIAGNOSIS — I781 Nevus, non-neoplastic: Secondary | ICD-10-CM | POA: Diagnosis not present

## 2014-07-25 DIAGNOSIS — R300 Dysuria: Secondary | ICD-10-CM | POA: Diagnosis not present

## 2014-07-27 DIAGNOSIS — M24562 Contracture, left knee: Secondary | ICD-10-CM | POA: Diagnosis not present

## 2014-07-27 DIAGNOSIS — G40909 Epilepsy, unspecified, not intractable, without status epilepticus: Secondary | ICD-10-CM | POA: Diagnosis not present

## 2014-07-27 DIAGNOSIS — R1311 Dysphagia, oral phase: Secondary | ICD-10-CM | POA: Diagnosis not present

## 2014-07-27 DIAGNOSIS — M24571 Contracture, right ankle: Secondary | ICD-10-CM | POA: Diagnosis not present

## 2014-07-27 DIAGNOSIS — M24572 Contracture, left ankle: Secondary | ICD-10-CM | POA: Diagnosis not present

## 2014-07-27 DIAGNOSIS — M24561 Contracture, right knee: Secondary | ICD-10-CM | POA: Diagnosis not present

## 2014-07-28 DIAGNOSIS — M24572 Contracture, left ankle: Secondary | ICD-10-CM | POA: Diagnosis not present

## 2014-07-28 DIAGNOSIS — G40909 Epilepsy, unspecified, not intractable, without status epilepticus: Secondary | ICD-10-CM | POA: Diagnosis not present

## 2014-07-28 DIAGNOSIS — M24562 Contracture, left knee: Secondary | ICD-10-CM | POA: Diagnosis not present

## 2014-07-28 DIAGNOSIS — R1311 Dysphagia, oral phase: Secondary | ICD-10-CM | POA: Diagnosis not present

## 2014-07-28 DIAGNOSIS — M24571 Contracture, right ankle: Secondary | ICD-10-CM | POA: Diagnosis not present

## 2014-07-28 DIAGNOSIS — M24561 Contracture, right knee: Secondary | ICD-10-CM | POA: Diagnosis not present

## 2014-07-29 DIAGNOSIS — M24561 Contracture, right knee: Secondary | ICD-10-CM | POA: Diagnosis not present

## 2014-07-29 DIAGNOSIS — M24572 Contracture, left ankle: Secondary | ICD-10-CM | POA: Diagnosis not present

## 2014-07-29 DIAGNOSIS — M24571 Contracture, right ankle: Secondary | ICD-10-CM | POA: Diagnosis not present

## 2014-07-29 DIAGNOSIS — M24562 Contracture, left knee: Secondary | ICD-10-CM | POA: Diagnosis not present

## 2014-07-29 DIAGNOSIS — G40909 Epilepsy, unspecified, not intractable, without status epilepticus: Secondary | ICD-10-CM | POA: Diagnosis not present

## 2014-07-29 DIAGNOSIS — R1311 Dysphagia, oral phase: Secondary | ICD-10-CM | POA: Diagnosis not present

## 2014-07-30 DIAGNOSIS — M24571 Contracture, right ankle: Secondary | ICD-10-CM | POA: Diagnosis not present

## 2014-07-30 DIAGNOSIS — G40909 Epilepsy, unspecified, not intractable, without status epilepticus: Secondary | ICD-10-CM | POA: Diagnosis not present

## 2014-07-30 DIAGNOSIS — M24561 Contracture, right knee: Secondary | ICD-10-CM | POA: Diagnosis not present

## 2014-07-30 DIAGNOSIS — M24572 Contracture, left ankle: Secondary | ICD-10-CM | POA: Diagnosis not present

## 2014-07-30 DIAGNOSIS — M24562 Contracture, left knee: Secondary | ICD-10-CM | POA: Diagnosis not present

## 2014-07-30 DIAGNOSIS — R1311 Dysphagia, oral phase: Secondary | ICD-10-CM | POA: Diagnosis not present

## 2014-07-31 DIAGNOSIS — R1311 Dysphagia, oral phase: Secondary | ICD-10-CM | POA: Diagnosis not present

## 2014-07-31 DIAGNOSIS — M24572 Contracture, left ankle: Secondary | ICD-10-CM | POA: Diagnosis not present

## 2014-07-31 DIAGNOSIS — M24561 Contracture, right knee: Secondary | ICD-10-CM | POA: Diagnosis not present

## 2014-07-31 DIAGNOSIS — M24562 Contracture, left knee: Secondary | ICD-10-CM | POA: Diagnosis not present

## 2014-07-31 DIAGNOSIS — L738 Other specified follicular disorders: Secondary | ICD-10-CM | POA: Diagnosis not present

## 2014-07-31 DIAGNOSIS — L89154 Pressure ulcer of sacral region, stage 4: Secondary | ICD-10-CM | POA: Diagnosis not present

## 2014-07-31 DIAGNOSIS — G40909 Epilepsy, unspecified, not intractable, without status epilepticus: Secondary | ICD-10-CM | POA: Diagnosis not present

## 2014-07-31 DIAGNOSIS — M24571 Contracture, right ankle: Secondary | ICD-10-CM | POA: Diagnosis not present

## 2014-08-03 DIAGNOSIS — G40909 Epilepsy, unspecified, not intractable, without status epilepticus: Secondary | ICD-10-CM | POA: Diagnosis not present

## 2014-08-03 DIAGNOSIS — M24571 Contracture, right ankle: Secondary | ICD-10-CM | POA: Diagnosis not present

## 2014-08-03 DIAGNOSIS — R1311 Dysphagia, oral phase: Secondary | ICD-10-CM | POA: Diagnosis not present

## 2014-08-03 DIAGNOSIS — M24572 Contracture, left ankle: Secondary | ICD-10-CM | POA: Diagnosis not present

## 2014-08-03 DIAGNOSIS — M24562 Contracture, left knee: Secondary | ICD-10-CM | POA: Diagnosis not present

## 2014-08-03 DIAGNOSIS — M24561 Contracture, right knee: Secondary | ICD-10-CM | POA: Diagnosis not present

## 2014-08-04 DIAGNOSIS — M24562 Contracture, left knee: Secondary | ICD-10-CM | POA: Diagnosis not present

## 2014-08-04 DIAGNOSIS — M24572 Contracture, left ankle: Secondary | ICD-10-CM | POA: Diagnosis not present

## 2014-08-04 DIAGNOSIS — R1311 Dysphagia, oral phase: Secondary | ICD-10-CM | POA: Diagnosis not present

## 2014-08-04 DIAGNOSIS — M24561 Contracture, right knee: Secondary | ICD-10-CM | POA: Diagnosis not present

## 2014-08-04 DIAGNOSIS — M24571 Contracture, right ankle: Secondary | ICD-10-CM | POA: Diagnosis not present

## 2014-08-04 DIAGNOSIS — G40909 Epilepsy, unspecified, not intractable, without status epilepticus: Secondary | ICD-10-CM | POA: Diagnosis not present

## 2014-08-05 DIAGNOSIS — G40909 Epilepsy, unspecified, not intractable, without status epilepticus: Secondary | ICD-10-CM | POA: Diagnosis not present

## 2014-08-05 DIAGNOSIS — M24561 Contracture, right knee: Secondary | ICD-10-CM | POA: Diagnosis not present

## 2014-08-05 DIAGNOSIS — M24572 Contracture, left ankle: Secondary | ICD-10-CM | POA: Diagnosis not present

## 2014-08-05 DIAGNOSIS — M24571 Contracture, right ankle: Secondary | ICD-10-CM | POA: Diagnosis not present

## 2014-08-05 DIAGNOSIS — R1311 Dysphagia, oral phase: Secondary | ICD-10-CM | POA: Diagnosis not present

## 2014-08-05 DIAGNOSIS — M24562 Contracture, left knee: Secondary | ICD-10-CM | POA: Diagnosis not present

## 2014-08-06 DIAGNOSIS — M24571 Contracture, right ankle: Secondary | ICD-10-CM | POA: Diagnosis not present

## 2014-08-06 DIAGNOSIS — G40909 Epilepsy, unspecified, not intractable, without status epilepticus: Secondary | ICD-10-CM | POA: Diagnosis not present

## 2014-08-06 DIAGNOSIS — M24572 Contracture, left ankle: Secondary | ICD-10-CM | POA: Diagnosis not present

## 2014-08-06 DIAGNOSIS — M24561 Contracture, right knee: Secondary | ICD-10-CM | POA: Diagnosis not present

## 2014-08-06 DIAGNOSIS — R1311 Dysphagia, oral phase: Secondary | ICD-10-CM | POA: Diagnosis not present

## 2014-08-06 DIAGNOSIS — M24562 Contracture, left knee: Secondary | ICD-10-CM | POA: Diagnosis not present

## 2014-08-07 DIAGNOSIS — M24571 Contracture, right ankle: Secondary | ICD-10-CM | POA: Diagnosis not present

## 2014-08-07 DIAGNOSIS — L98499 Non-pressure chronic ulcer of skin of other sites with unspecified severity: Secondary | ICD-10-CM | POA: Diagnosis not present

## 2014-08-07 DIAGNOSIS — R1311 Dysphagia, oral phase: Secondary | ICD-10-CM | POA: Diagnosis not present

## 2014-08-07 DIAGNOSIS — G40909 Epilepsy, unspecified, not intractable, without status epilepticus: Secondary | ICD-10-CM | POA: Diagnosis not present

## 2014-08-07 DIAGNOSIS — M24572 Contracture, left ankle: Secondary | ICD-10-CM | POA: Diagnosis not present

## 2014-08-07 DIAGNOSIS — M24562 Contracture, left knee: Secondary | ICD-10-CM | POA: Diagnosis not present

## 2014-08-07 DIAGNOSIS — L89154 Pressure ulcer of sacral region, stage 4: Secondary | ICD-10-CM | POA: Diagnosis not present

## 2014-08-07 DIAGNOSIS — I781 Nevus, non-neoplastic: Secondary | ICD-10-CM | POA: Diagnosis not present

## 2014-08-07 DIAGNOSIS — M24561 Contracture, right knee: Secondary | ICD-10-CM | POA: Diagnosis not present

## 2014-08-08 DIAGNOSIS — R300 Dysuria: Secondary | ICD-10-CM | POA: Diagnosis not present

## 2014-08-10 DIAGNOSIS — M24562 Contracture, left knee: Secondary | ICD-10-CM | POA: Diagnosis not present

## 2014-08-10 DIAGNOSIS — G40909 Epilepsy, unspecified, not intractable, without status epilepticus: Secondary | ICD-10-CM | POA: Diagnosis not present

## 2014-08-10 DIAGNOSIS — M24571 Contracture, right ankle: Secondary | ICD-10-CM | POA: Diagnosis not present

## 2014-08-10 DIAGNOSIS — M24572 Contracture, left ankle: Secondary | ICD-10-CM | POA: Diagnosis not present

## 2014-08-10 DIAGNOSIS — R1311 Dysphagia, oral phase: Secondary | ICD-10-CM | POA: Diagnosis not present

## 2014-08-10 DIAGNOSIS — M24561 Contracture, right knee: Secondary | ICD-10-CM | POA: Diagnosis not present

## 2014-08-11 DIAGNOSIS — M24561 Contracture, right knee: Secondary | ICD-10-CM | POA: Diagnosis not present

## 2014-08-11 DIAGNOSIS — G40909 Epilepsy, unspecified, not intractable, without status epilepticus: Secondary | ICD-10-CM | POA: Diagnosis not present

## 2014-08-11 DIAGNOSIS — M24571 Contracture, right ankle: Secondary | ICD-10-CM | POA: Diagnosis not present

## 2014-08-11 DIAGNOSIS — M24562 Contracture, left knee: Secondary | ICD-10-CM | POA: Diagnosis not present

## 2014-08-11 DIAGNOSIS — N390 Urinary tract infection, site not specified: Secondary | ICD-10-CM | POA: Diagnosis not present

## 2014-08-11 DIAGNOSIS — R1311 Dysphagia, oral phase: Secondary | ICD-10-CM | POA: Diagnosis not present

## 2014-08-11 DIAGNOSIS — M24572 Contracture, left ankle: Secondary | ICD-10-CM | POA: Diagnosis not present

## 2014-08-12 DIAGNOSIS — M24572 Contracture, left ankle: Secondary | ICD-10-CM | POA: Diagnosis not present

## 2014-08-12 DIAGNOSIS — G40909 Epilepsy, unspecified, not intractable, without status epilepticus: Secondary | ICD-10-CM | POA: Diagnosis not present

## 2014-08-12 DIAGNOSIS — M24562 Contracture, left knee: Secondary | ICD-10-CM | POA: Diagnosis not present

## 2014-08-12 DIAGNOSIS — M24561 Contracture, right knee: Secondary | ICD-10-CM | POA: Diagnosis not present

## 2014-08-12 DIAGNOSIS — R1311 Dysphagia, oral phase: Secondary | ICD-10-CM | POA: Diagnosis not present

## 2014-08-12 DIAGNOSIS — M24571 Contracture, right ankle: Secondary | ICD-10-CM | POA: Diagnosis not present

## 2014-08-13 DIAGNOSIS — M24562 Contracture, left knee: Secondary | ICD-10-CM | POA: Diagnosis not present

## 2014-08-13 DIAGNOSIS — R1311 Dysphagia, oral phase: Secondary | ICD-10-CM | POA: Diagnosis not present

## 2014-08-13 DIAGNOSIS — G40909 Epilepsy, unspecified, not intractable, without status epilepticus: Secondary | ICD-10-CM | POA: Diagnosis not present

## 2014-08-13 DIAGNOSIS — M24561 Contracture, right knee: Secondary | ICD-10-CM | POA: Diagnosis not present

## 2014-08-13 DIAGNOSIS — M24572 Contracture, left ankle: Secondary | ICD-10-CM | POA: Diagnosis not present

## 2014-08-13 DIAGNOSIS — M24571 Contracture, right ankle: Secondary | ICD-10-CM | POA: Diagnosis not present

## 2014-08-14 DIAGNOSIS — M24571 Contracture, right ankle: Secondary | ICD-10-CM | POA: Diagnosis not present

## 2014-08-14 DIAGNOSIS — M24562 Contracture, left knee: Secondary | ICD-10-CM | POA: Diagnosis not present

## 2014-08-14 DIAGNOSIS — G40909 Epilepsy, unspecified, not intractable, without status epilepticus: Secondary | ICD-10-CM | POA: Diagnosis not present

## 2014-08-14 DIAGNOSIS — M24572 Contracture, left ankle: Secondary | ICD-10-CM | POA: Diagnosis not present

## 2014-08-14 DIAGNOSIS — M24561 Contracture, right knee: Secondary | ICD-10-CM | POA: Diagnosis not present

## 2014-08-14 DIAGNOSIS — R1311 Dysphagia, oral phase: Secondary | ICD-10-CM | POA: Diagnosis not present

## 2014-08-17 DIAGNOSIS — M24571 Contracture, right ankle: Secondary | ICD-10-CM | POA: Diagnosis not present

## 2014-08-17 DIAGNOSIS — L03031 Cellulitis of right toe: Secondary | ICD-10-CM | POA: Diagnosis not present

## 2014-08-17 DIAGNOSIS — M24562 Contracture, left knee: Secondary | ICD-10-CM | POA: Diagnosis not present

## 2014-08-17 DIAGNOSIS — R1311 Dysphagia, oral phase: Secondary | ICD-10-CM | POA: Diagnosis not present

## 2014-08-17 DIAGNOSIS — M24561 Contracture, right knee: Secondary | ICD-10-CM | POA: Diagnosis not present

## 2014-08-17 DIAGNOSIS — M24572 Contracture, left ankle: Secondary | ICD-10-CM | POA: Diagnosis not present

## 2014-08-17 DIAGNOSIS — Q845 Enlarged and hypertrophic nails: Secondary | ICD-10-CM | POA: Diagnosis not present

## 2014-08-17 DIAGNOSIS — L600 Ingrowing nail: Secondary | ICD-10-CM | POA: Diagnosis not present

## 2014-08-17 DIAGNOSIS — M79671 Pain in right foot: Secondary | ICD-10-CM | POA: Diagnosis not present

## 2014-08-17 DIAGNOSIS — G40909 Epilepsy, unspecified, not intractable, without status epilepticus: Secondary | ICD-10-CM | POA: Diagnosis not present

## 2014-08-18 DIAGNOSIS — M24561 Contracture, right knee: Secondary | ICD-10-CM | POA: Diagnosis not present

## 2014-08-18 DIAGNOSIS — G40909 Epilepsy, unspecified, not intractable, without status epilepticus: Secondary | ICD-10-CM | POA: Diagnosis not present

## 2014-08-18 DIAGNOSIS — R1311 Dysphagia, oral phase: Secondary | ICD-10-CM | POA: Diagnosis not present

## 2014-08-18 DIAGNOSIS — M24572 Contracture, left ankle: Secondary | ICD-10-CM | POA: Diagnosis not present

## 2014-08-18 DIAGNOSIS — M24571 Contracture, right ankle: Secondary | ICD-10-CM | POA: Diagnosis not present

## 2014-08-18 DIAGNOSIS — M24562 Contracture, left knee: Secondary | ICD-10-CM | POA: Diagnosis not present

## 2014-08-20 DIAGNOSIS — G40909 Epilepsy, unspecified, not intractable, without status epilepticus: Secondary | ICD-10-CM | POA: Diagnosis not present

## 2014-08-20 DIAGNOSIS — R1311 Dysphagia, oral phase: Secondary | ICD-10-CM | POA: Diagnosis not present

## 2014-08-20 DIAGNOSIS — M24572 Contracture, left ankle: Secondary | ICD-10-CM | POA: Diagnosis not present

## 2014-08-20 DIAGNOSIS — M24561 Contracture, right knee: Secondary | ICD-10-CM | POA: Diagnosis not present

## 2014-08-20 DIAGNOSIS — M24562 Contracture, left knee: Secondary | ICD-10-CM | POA: Diagnosis not present

## 2014-08-20 DIAGNOSIS — M24571 Contracture, right ankle: Secondary | ICD-10-CM | POA: Diagnosis not present

## 2014-08-21 DIAGNOSIS — M24571 Contracture, right ankle: Secondary | ICD-10-CM | POA: Diagnosis not present

## 2014-08-21 DIAGNOSIS — M24572 Contracture, left ankle: Secondary | ICD-10-CM | POA: Diagnosis not present

## 2014-08-21 DIAGNOSIS — M24562 Contracture, left knee: Secondary | ICD-10-CM | POA: Diagnosis not present

## 2014-08-21 DIAGNOSIS — G40909 Epilepsy, unspecified, not intractable, without status epilepticus: Secondary | ICD-10-CM | POA: Diagnosis not present

## 2014-08-21 DIAGNOSIS — R1311 Dysphagia, oral phase: Secondary | ICD-10-CM | POA: Diagnosis not present

## 2014-08-21 DIAGNOSIS — M24561 Contracture, right knee: Secondary | ICD-10-CM | POA: Diagnosis not present

## 2014-08-22 DIAGNOSIS — R1311 Dysphagia, oral phase: Secondary | ICD-10-CM | POA: Diagnosis not present

## 2014-08-22 DIAGNOSIS — M24572 Contracture, left ankle: Secondary | ICD-10-CM | POA: Diagnosis not present

## 2014-08-22 DIAGNOSIS — M24571 Contracture, right ankle: Secondary | ICD-10-CM | POA: Diagnosis not present

## 2014-08-22 DIAGNOSIS — M24562 Contracture, left knee: Secondary | ICD-10-CM | POA: Diagnosis not present

## 2014-08-22 DIAGNOSIS — G40909 Epilepsy, unspecified, not intractable, without status epilepticus: Secondary | ICD-10-CM | POA: Diagnosis not present

## 2014-08-22 DIAGNOSIS — M24561 Contracture, right knee: Secondary | ICD-10-CM | POA: Diagnosis not present

## 2014-08-24 DIAGNOSIS — R1311 Dysphagia, oral phase: Secondary | ICD-10-CM | POA: Diagnosis not present

## 2014-08-24 DIAGNOSIS — G40909 Epilepsy, unspecified, not intractable, without status epilepticus: Secondary | ICD-10-CM | POA: Diagnosis not present

## 2014-08-24 DIAGNOSIS — M24572 Contracture, left ankle: Secondary | ICD-10-CM | POA: Diagnosis not present

## 2014-08-24 DIAGNOSIS — M24571 Contracture, right ankle: Secondary | ICD-10-CM | POA: Diagnosis not present

## 2014-08-24 DIAGNOSIS — M24561 Contracture, right knee: Secondary | ICD-10-CM | POA: Diagnosis not present

## 2014-08-24 DIAGNOSIS — M24562 Contracture, left knee: Secondary | ICD-10-CM | POA: Diagnosis not present

## 2014-08-25 DIAGNOSIS — M24572 Contracture, left ankle: Secondary | ICD-10-CM | POA: Diagnosis not present

## 2014-08-25 DIAGNOSIS — M24561 Contracture, right knee: Secondary | ICD-10-CM | POA: Diagnosis not present

## 2014-08-25 DIAGNOSIS — G629 Polyneuropathy, unspecified: Secondary | ICD-10-CM | POA: Diagnosis not present

## 2014-08-25 DIAGNOSIS — M24571 Contracture, right ankle: Secondary | ICD-10-CM | POA: Diagnosis not present

## 2014-08-25 DIAGNOSIS — M24562 Contracture, left knee: Secondary | ICD-10-CM | POA: Diagnosis not present

## 2014-08-25 DIAGNOSIS — G40909 Epilepsy, unspecified, not intractable, without status epilepticus: Secondary | ICD-10-CM | POA: Diagnosis not present

## 2014-08-25 DIAGNOSIS — R1311 Dysphagia, oral phase: Secondary | ICD-10-CM | POA: Diagnosis not present

## 2014-08-26 DIAGNOSIS — M24572 Contracture, left ankle: Secondary | ICD-10-CM | POA: Diagnosis not present

## 2014-08-26 DIAGNOSIS — R1311 Dysphagia, oral phase: Secondary | ICD-10-CM | POA: Diagnosis not present

## 2014-08-26 DIAGNOSIS — G40909 Epilepsy, unspecified, not intractable, without status epilepticus: Secondary | ICD-10-CM | POA: Diagnosis not present

## 2014-08-26 DIAGNOSIS — M24561 Contracture, right knee: Secondary | ICD-10-CM | POA: Diagnosis not present

## 2014-08-26 DIAGNOSIS — M24562 Contracture, left knee: Secondary | ICD-10-CM | POA: Diagnosis not present

## 2014-08-26 DIAGNOSIS — M24571 Contracture, right ankle: Secondary | ICD-10-CM | POA: Diagnosis not present

## 2014-08-27 DIAGNOSIS — M24572 Contracture, left ankle: Secondary | ICD-10-CM | POA: Diagnosis not present

## 2014-08-27 DIAGNOSIS — R1311 Dysphagia, oral phase: Secondary | ICD-10-CM | POA: Diagnosis not present

## 2014-08-27 DIAGNOSIS — M24561 Contracture, right knee: Secondary | ICD-10-CM | POA: Diagnosis not present

## 2014-08-27 DIAGNOSIS — G40909 Epilepsy, unspecified, not intractable, without status epilepticus: Secondary | ICD-10-CM | POA: Diagnosis not present

## 2014-08-27 DIAGNOSIS — M24562 Contracture, left knee: Secondary | ICD-10-CM | POA: Diagnosis not present

## 2014-08-27 DIAGNOSIS — M24571 Contracture, right ankle: Secondary | ICD-10-CM | POA: Diagnosis not present

## 2014-08-28 DIAGNOSIS — L98499 Non-pressure chronic ulcer of skin of other sites with unspecified severity: Secondary | ICD-10-CM | POA: Diagnosis not present

## 2014-08-28 DIAGNOSIS — M24561 Contracture, right knee: Secondary | ICD-10-CM | POA: Diagnosis not present

## 2014-08-28 DIAGNOSIS — M24572 Contracture, left ankle: Secondary | ICD-10-CM | POA: Diagnosis not present

## 2014-08-28 DIAGNOSIS — M24562 Contracture, left knee: Secondary | ICD-10-CM | POA: Diagnosis not present

## 2014-08-28 DIAGNOSIS — L89154 Pressure ulcer of sacral region, stage 4: Secondary | ICD-10-CM | POA: Diagnosis not present

## 2014-08-28 DIAGNOSIS — L89220 Pressure ulcer of left hip, unstageable: Secondary | ICD-10-CM | POA: Diagnosis not present

## 2014-08-28 DIAGNOSIS — R1311 Dysphagia, oral phase: Secondary | ICD-10-CM | POA: Diagnosis not present

## 2014-08-28 DIAGNOSIS — G40909 Epilepsy, unspecified, not intractable, without status epilepticus: Secondary | ICD-10-CM | POA: Diagnosis not present

## 2014-08-28 DIAGNOSIS — M24571 Contracture, right ankle: Secondary | ICD-10-CM | POA: Diagnosis not present

## 2014-08-31 DIAGNOSIS — M24562 Contracture, left knee: Secondary | ICD-10-CM | POA: Diagnosis not present

## 2014-08-31 DIAGNOSIS — M24561 Contracture, right knee: Secondary | ICD-10-CM | POA: Diagnosis not present

## 2014-08-31 DIAGNOSIS — R1311 Dysphagia, oral phase: Secondary | ICD-10-CM | POA: Diagnosis not present

## 2014-08-31 DIAGNOSIS — G40909 Epilepsy, unspecified, not intractable, without status epilepticus: Secondary | ICD-10-CM | POA: Diagnosis not present

## 2014-08-31 DIAGNOSIS — M24571 Contracture, right ankle: Secondary | ICD-10-CM | POA: Diagnosis not present

## 2014-08-31 DIAGNOSIS — M24572 Contracture, left ankle: Secondary | ICD-10-CM | POA: Diagnosis not present

## 2014-09-01 DIAGNOSIS — M24561 Contracture, right knee: Secondary | ICD-10-CM | POA: Diagnosis not present

## 2014-09-01 DIAGNOSIS — M24572 Contracture, left ankle: Secondary | ICD-10-CM | POA: Diagnosis not present

## 2014-09-01 DIAGNOSIS — G40909 Epilepsy, unspecified, not intractable, without status epilepticus: Secondary | ICD-10-CM | POA: Diagnosis not present

## 2014-09-01 DIAGNOSIS — R1311 Dysphagia, oral phase: Secondary | ICD-10-CM | POA: Diagnosis not present

## 2014-09-01 DIAGNOSIS — M24562 Contracture, left knee: Secondary | ICD-10-CM | POA: Diagnosis not present

## 2014-09-01 DIAGNOSIS — M24571 Contracture, right ankle: Secondary | ICD-10-CM | POA: Diagnosis not present

## 2014-09-02 DIAGNOSIS — G40909 Epilepsy, unspecified, not intractable, without status epilepticus: Secondary | ICD-10-CM | POA: Diagnosis not present

## 2014-09-02 DIAGNOSIS — R1311 Dysphagia, oral phase: Secondary | ICD-10-CM | POA: Diagnosis not present

## 2014-09-02 DIAGNOSIS — M24562 Contracture, left knee: Secondary | ICD-10-CM | POA: Diagnosis not present

## 2014-09-02 DIAGNOSIS — M24571 Contracture, right ankle: Secondary | ICD-10-CM | POA: Diagnosis not present

## 2014-09-02 DIAGNOSIS — M24572 Contracture, left ankle: Secondary | ICD-10-CM | POA: Diagnosis not present

## 2014-09-02 DIAGNOSIS — M24561 Contracture, right knee: Secondary | ICD-10-CM | POA: Diagnosis not present

## 2014-09-03 DIAGNOSIS — M24572 Contracture, left ankle: Secondary | ICD-10-CM | POA: Diagnosis not present

## 2014-09-03 DIAGNOSIS — G40909 Epilepsy, unspecified, not intractable, without status epilepticus: Secondary | ICD-10-CM | POA: Diagnosis not present

## 2014-09-03 DIAGNOSIS — M24571 Contracture, right ankle: Secondary | ICD-10-CM | POA: Diagnosis not present

## 2014-09-03 DIAGNOSIS — R1311 Dysphagia, oral phase: Secondary | ICD-10-CM | POA: Diagnosis not present

## 2014-09-03 DIAGNOSIS — M24561 Contracture, right knee: Secondary | ICD-10-CM | POA: Diagnosis not present

## 2014-09-03 DIAGNOSIS — M24562 Contracture, left knee: Secondary | ICD-10-CM | POA: Diagnosis not present

## 2014-09-04 DIAGNOSIS — M24571 Contracture, right ankle: Secondary | ICD-10-CM | POA: Diagnosis not present

## 2014-09-04 DIAGNOSIS — L97119 Non-pressure chronic ulcer of right thigh with unspecified severity: Secondary | ICD-10-CM | POA: Diagnosis not present

## 2014-09-04 DIAGNOSIS — L89154 Pressure ulcer of sacral region, stage 4: Secondary | ICD-10-CM | POA: Diagnosis not present

## 2014-09-04 DIAGNOSIS — M24562 Contracture, left knee: Secondary | ICD-10-CM | POA: Diagnosis not present

## 2014-09-04 DIAGNOSIS — G40909 Epilepsy, unspecified, not intractable, without status epilepticus: Secondary | ICD-10-CM | POA: Diagnosis not present

## 2014-09-04 DIAGNOSIS — R1311 Dysphagia, oral phase: Secondary | ICD-10-CM | POA: Diagnosis not present

## 2014-09-04 DIAGNOSIS — M24561 Contracture, right knee: Secondary | ICD-10-CM | POA: Diagnosis not present

## 2014-09-04 DIAGNOSIS — L98499 Non-pressure chronic ulcer of skin of other sites with unspecified severity: Secondary | ICD-10-CM | POA: Diagnosis not present

## 2014-09-04 DIAGNOSIS — M24572 Contracture, left ankle: Secondary | ICD-10-CM | POA: Diagnosis not present

## 2014-09-06 DIAGNOSIS — M24572 Contracture, left ankle: Secondary | ICD-10-CM | POA: Diagnosis not present

## 2014-09-06 DIAGNOSIS — R1311 Dysphagia, oral phase: Secondary | ICD-10-CM | POA: Diagnosis not present

## 2014-09-06 DIAGNOSIS — M24571 Contracture, right ankle: Secondary | ICD-10-CM | POA: Diagnosis not present

## 2014-09-06 DIAGNOSIS — M24562 Contracture, left knee: Secondary | ICD-10-CM | POA: Diagnosis not present

## 2014-09-06 DIAGNOSIS — G40909 Epilepsy, unspecified, not intractable, without status epilepticus: Secondary | ICD-10-CM | POA: Diagnosis not present

## 2014-09-06 DIAGNOSIS — M24561 Contracture, right knee: Secondary | ICD-10-CM | POA: Diagnosis not present

## 2014-09-08 DIAGNOSIS — M24572 Contracture, left ankle: Secondary | ICD-10-CM | POA: Diagnosis not present

## 2014-09-08 DIAGNOSIS — G40909 Epilepsy, unspecified, not intractable, without status epilepticus: Secondary | ICD-10-CM | POA: Diagnosis not present

## 2014-09-08 DIAGNOSIS — L89154 Pressure ulcer of sacral region, stage 4: Secondary | ICD-10-CM | POA: Diagnosis not present

## 2014-09-08 DIAGNOSIS — M24562 Contracture, left knee: Secondary | ICD-10-CM | POA: Diagnosis not present

## 2014-09-08 DIAGNOSIS — M24571 Contracture, right ankle: Secondary | ICD-10-CM | POA: Diagnosis not present

## 2014-09-08 DIAGNOSIS — R1311 Dysphagia, oral phase: Secondary | ICD-10-CM | POA: Diagnosis not present

## 2014-09-08 DIAGNOSIS — M24561 Contracture, right knee: Secondary | ICD-10-CM | POA: Diagnosis not present

## 2014-09-09 DIAGNOSIS — R1311 Dysphagia, oral phase: Secondary | ICD-10-CM | POA: Diagnosis not present

## 2014-09-09 DIAGNOSIS — M24572 Contracture, left ankle: Secondary | ICD-10-CM | POA: Diagnosis not present

## 2014-09-09 DIAGNOSIS — G40909 Epilepsy, unspecified, not intractable, without status epilepticus: Secondary | ICD-10-CM | POA: Diagnosis not present

## 2014-09-09 DIAGNOSIS — M24571 Contracture, right ankle: Secondary | ICD-10-CM | POA: Diagnosis not present

## 2014-09-09 DIAGNOSIS — M24562 Contracture, left knee: Secondary | ICD-10-CM | POA: Diagnosis not present

## 2014-09-09 DIAGNOSIS — M24561 Contracture, right knee: Secondary | ICD-10-CM | POA: Diagnosis not present

## 2014-09-09 DIAGNOSIS — L89154 Pressure ulcer of sacral region, stage 4: Secondary | ICD-10-CM | POA: Diagnosis not present

## 2014-09-10 DIAGNOSIS — M24572 Contracture, left ankle: Secondary | ICD-10-CM | POA: Diagnosis not present

## 2014-09-10 DIAGNOSIS — G40909 Epilepsy, unspecified, not intractable, without status epilepticus: Secondary | ICD-10-CM | POA: Diagnosis not present

## 2014-09-10 DIAGNOSIS — M24561 Contracture, right knee: Secondary | ICD-10-CM | POA: Diagnosis not present

## 2014-09-10 DIAGNOSIS — R1311 Dysphagia, oral phase: Secondary | ICD-10-CM | POA: Diagnosis not present

## 2014-09-10 DIAGNOSIS — M24562 Contracture, left knee: Secondary | ICD-10-CM | POA: Diagnosis not present

## 2014-09-10 DIAGNOSIS — M24571 Contracture, right ankle: Secondary | ICD-10-CM | POA: Diagnosis not present

## 2014-09-11 DIAGNOSIS — R1311 Dysphagia, oral phase: Secondary | ICD-10-CM | POA: Diagnosis not present

## 2014-09-11 DIAGNOSIS — M24571 Contracture, right ankle: Secondary | ICD-10-CM | POA: Diagnosis not present

## 2014-09-11 DIAGNOSIS — M24572 Contracture, left ankle: Secondary | ICD-10-CM | POA: Diagnosis not present

## 2014-09-11 DIAGNOSIS — G40909 Epilepsy, unspecified, not intractable, without status epilepticus: Secondary | ICD-10-CM | POA: Diagnosis not present

## 2014-09-11 DIAGNOSIS — L98499 Non-pressure chronic ulcer of skin of other sites with unspecified severity: Secondary | ICD-10-CM | POA: Diagnosis not present

## 2014-09-11 DIAGNOSIS — M24561 Contracture, right knee: Secondary | ICD-10-CM | POA: Diagnosis not present

## 2014-09-11 DIAGNOSIS — L89154 Pressure ulcer of sacral region, stage 4: Secondary | ICD-10-CM | POA: Diagnosis not present

## 2014-09-11 DIAGNOSIS — M24562 Contracture, left knee: Secondary | ICD-10-CM | POA: Diagnosis not present

## 2014-09-14 DIAGNOSIS — M24561 Contracture, right knee: Secondary | ICD-10-CM | POA: Diagnosis not present

## 2014-09-14 DIAGNOSIS — R1311 Dysphagia, oral phase: Secondary | ICD-10-CM | POA: Diagnosis not present

## 2014-09-14 DIAGNOSIS — G40909 Epilepsy, unspecified, not intractable, without status epilepticus: Secondary | ICD-10-CM | POA: Diagnosis not present

## 2014-09-14 DIAGNOSIS — M24562 Contracture, left knee: Secondary | ICD-10-CM | POA: Diagnosis not present

## 2014-09-14 DIAGNOSIS — M24572 Contracture, left ankle: Secondary | ICD-10-CM | POA: Diagnosis not present

## 2014-09-14 DIAGNOSIS — M24571 Contracture, right ankle: Secondary | ICD-10-CM | POA: Diagnosis not present

## 2014-09-15 DIAGNOSIS — M24571 Contracture, right ankle: Secondary | ICD-10-CM | POA: Diagnosis not present

## 2014-09-15 DIAGNOSIS — G40909 Epilepsy, unspecified, not intractable, without status epilepticus: Secondary | ICD-10-CM | POA: Diagnosis not present

## 2014-09-15 DIAGNOSIS — M24572 Contracture, left ankle: Secondary | ICD-10-CM | POA: Diagnosis not present

## 2014-09-15 DIAGNOSIS — M24561 Contracture, right knee: Secondary | ICD-10-CM | POA: Diagnosis not present

## 2014-09-15 DIAGNOSIS — R1311 Dysphagia, oral phase: Secondary | ICD-10-CM | POA: Diagnosis not present

## 2014-09-15 DIAGNOSIS — M24562 Contracture, left knee: Secondary | ICD-10-CM | POA: Diagnosis not present

## 2014-09-16 DIAGNOSIS — M24571 Contracture, right ankle: Secondary | ICD-10-CM | POA: Diagnosis not present

## 2014-09-16 DIAGNOSIS — M24561 Contracture, right knee: Secondary | ICD-10-CM | POA: Diagnosis not present

## 2014-09-16 DIAGNOSIS — G40909 Epilepsy, unspecified, not intractable, without status epilepticus: Secondary | ICD-10-CM | POA: Diagnosis not present

## 2014-09-16 DIAGNOSIS — M24572 Contracture, left ankle: Secondary | ICD-10-CM | POA: Diagnosis not present

## 2014-09-16 DIAGNOSIS — M24562 Contracture, left knee: Secondary | ICD-10-CM | POA: Diagnosis not present

## 2014-09-16 DIAGNOSIS — R1311 Dysphagia, oral phase: Secondary | ICD-10-CM | POA: Diagnosis not present

## 2014-09-17 DIAGNOSIS — G40909 Epilepsy, unspecified, not intractable, without status epilepticus: Secondary | ICD-10-CM | POA: Diagnosis not present

## 2014-09-17 DIAGNOSIS — R1311 Dysphagia, oral phase: Secondary | ICD-10-CM | POA: Diagnosis not present

## 2014-09-17 DIAGNOSIS — M24562 Contracture, left knee: Secondary | ICD-10-CM | POA: Diagnosis not present

## 2014-09-17 DIAGNOSIS — M24561 Contracture, right knee: Secondary | ICD-10-CM | POA: Diagnosis not present

## 2014-09-17 DIAGNOSIS — M24571 Contracture, right ankle: Secondary | ICD-10-CM | POA: Diagnosis not present

## 2014-09-17 DIAGNOSIS — M24572 Contracture, left ankle: Secondary | ICD-10-CM | POA: Diagnosis not present

## 2014-09-18 DIAGNOSIS — M24562 Contracture, left knee: Secondary | ICD-10-CM | POA: Diagnosis not present

## 2014-09-18 DIAGNOSIS — M24572 Contracture, left ankle: Secondary | ICD-10-CM | POA: Diagnosis not present

## 2014-09-18 DIAGNOSIS — R1311 Dysphagia, oral phase: Secondary | ICD-10-CM | POA: Diagnosis not present

## 2014-09-18 DIAGNOSIS — G40909 Epilepsy, unspecified, not intractable, without status epilepticus: Secondary | ICD-10-CM | POA: Diagnosis not present

## 2014-09-18 DIAGNOSIS — M24561 Contracture, right knee: Secondary | ICD-10-CM | POA: Diagnosis not present

## 2014-09-18 DIAGNOSIS — M24571 Contracture, right ankle: Secondary | ICD-10-CM | POA: Diagnosis not present

## 2014-09-21 DIAGNOSIS — M24572 Contracture, left ankle: Secondary | ICD-10-CM | POA: Diagnosis not present

## 2014-09-21 DIAGNOSIS — M24561 Contracture, right knee: Secondary | ICD-10-CM | POA: Diagnosis not present

## 2014-09-21 DIAGNOSIS — M24562 Contracture, left knee: Secondary | ICD-10-CM | POA: Diagnosis not present

## 2014-09-21 DIAGNOSIS — G40909 Epilepsy, unspecified, not intractable, without status epilepticus: Secondary | ICD-10-CM | POA: Diagnosis not present

## 2014-09-21 DIAGNOSIS — M24571 Contracture, right ankle: Secondary | ICD-10-CM | POA: Diagnosis not present

## 2014-09-21 DIAGNOSIS — R1311 Dysphagia, oral phase: Secondary | ICD-10-CM | POA: Diagnosis not present

## 2014-09-22 DIAGNOSIS — M24562 Contracture, left knee: Secondary | ICD-10-CM | POA: Diagnosis not present

## 2014-09-22 DIAGNOSIS — M24571 Contracture, right ankle: Secondary | ICD-10-CM | POA: Diagnosis not present

## 2014-09-22 DIAGNOSIS — R1311 Dysphagia, oral phase: Secondary | ICD-10-CM | POA: Diagnosis not present

## 2014-09-22 DIAGNOSIS — G40909 Epilepsy, unspecified, not intractable, without status epilepticus: Secondary | ICD-10-CM | POA: Diagnosis not present

## 2014-09-22 DIAGNOSIS — M24561 Contracture, right knee: Secondary | ICD-10-CM | POA: Diagnosis not present

## 2014-09-22 DIAGNOSIS — M24572 Contracture, left ankle: Secondary | ICD-10-CM | POA: Diagnosis not present

## 2014-09-23 DIAGNOSIS — R1311 Dysphagia, oral phase: Secondary | ICD-10-CM | POA: Diagnosis not present

## 2014-09-23 DIAGNOSIS — M24572 Contracture, left ankle: Secondary | ICD-10-CM | POA: Diagnosis not present

## 2014-09-23 DIAGNOSIS — G40909 Epilepsy, unspecified, not intractable, without status epilepticus: Secondary | ICD-10-CM | POA: Diagnosis not present

## 2014-09-23 DIAGNOSIS — M24561 Contracture, right knee: Secondary | ICD-10-CM | POA: Diagnosis not present

## 2014-09-23 DIAGNOSIS — M24571 Contracture, right ankle: Secondary | ICD-10-CM | POA: Diagnosis not present

## 2014-09-23 DIAGNOSIS — M24562 Contracture, left knee: Secondary | ICD-10-CM | POA: Diagnosis not present

## 2014-09-24 DIAGNOSIS — M24572 Contracture, left ankle: Secondary | ICD-10-CM | POA: Diagnosis not present

## 2014-09-24 DIAGNOSIS — G40909 Epilepsy, unspecified, not intractable, without status epilepticus: Secondary | ICD-10-CM | POA: Diagnosis not present

## 2014-09-24 DIAGNOSIS — M24562 Contracture, left knee: Secondary | ICD-10-CM | POA: Diagnosis not present

## 2014-09-24 DIAGNOSIS — M24561 Contracture, right knee: Secondary | ICD-10-CM | POA: Diagnosis not present

## 2014-09-24 DIAGNOSIS — R1311 Dysphagia, oral phase: Secondary | ICD-10-CM | POA: Diagnosis not present

## 2014-09-24 DIAGNOSIS — M24571 Contracture, right ankle: Secondary | ICD-10-CM | POA: Diagnosis not present

## 2014-09-25 DIAGNOSIS — G40909 Epilepsy, unspecified, not intractable, without status epilepticus: Secondary | ICD-10-CM | POA: Diagnosis not present

## 2014-09-25 DIAGNOSIS — M24571 Contracture, right ankle: Secondary | ICD-10-CM | POA: Diagnosis not present

## 2014-09-25 DIAGNOSIS — M24562 Contracture, left knee: Secondary | ICD-10-CM | POA: Diagnosis not present

## 2014-09-25 DIAGNOSIS — M24561 Contracture, right knee: Secondary | ICD-10-CM | POA: Diagnosis not present

## 2014-09-25 DIAGNOSIS — R1311 Dysphagia, oral phase: Secondary | ICD-10-CM | POA: Diagnosis not present

## 2014-09-25 DIAGNOSIS — M24572 Contracture, left ankle: Secondary | ICD-10-CM | POA: Diagnosis not present

## 2014-09-28 DIAGNOSIS — M24561 Contracture, right knee: Secondary | ICD-10-CM | POA: Diagnosis not present

## 2014-09-28 DIAGNOSIS — G40909 Epilepsy, unspecified, not intractable, without status epilepticus: Secondary | ICD-10-CM | POA: Diagnosis not present

## 2014-09-28 DIAGNOSIS — M24572 Contracture, left ankle: Secondary | ICD-10-CM | POA: Diagnosis not present

## 2014-09-28 DIAGNOSIS — M24562 Contracture, left knee: Secondary | ICD-10-CM | POA: Diagnosis not present

## 2014-09-28 DIAGNOSIS — M24571 Contracture, right ankle: Secondary | ICD-10-CM | POA: Diagnosis not present

## 2014-09-28 DIAGNOSIS — R1311 Dysphagia, oral phase: Secondary | ICD-10-CM | POA: Diagnosis not present

## 2014-09-29 DIAGNOSIS — M24572 Contracture, left ankle: Secondary | ICD-10-CM | POA: Diagnosis not present

## 2014-09-29 DIAGNOSIS — R1311 Dysphagia, oral phase: Secondary | ICD-10-CM | POA: Diagnosis not present

## 2014-09-29 DIAGNOSIS — G40909 Epilepsy, unspecified, not intractable, without status epilepticus: Secondary | ICD-10-CM | POA: Diagnosis not present

## 2014-09-29 DIAGNOSIS — M24561 Contracture, right knee: Secondary | ICD-10-CM | POA: Diagnosis not present

## 2014-09-29 DIAGNOSIS — M24571 Contracture, right ankle: Secondary | ICD-10-CM | POA: Diagnosis not present

## 2014-09-29 DIAGNOSIS — M24562 Contracture, left knee: Secondary | ICD-10-CM | POA: Diagnosis not present

## 2014-09-30 DIAGNOSIS — R1311 Dysphagia, oral phase: Secondary | ICD-10-CM | POA: Diagnosis not present

## 2014-09-30 DIAGNOSIS — M24561 Contracture, right knee: Secondary | ICD-10-CM | POA: Diagnosis not present

## 2014-09-30 DIAGNOSIS — M24562 Contracture, left knee: Secondary | ICD-10-CM | POA: Diagnosis not present

## 2014-09-30 DIAGNOSIS — M24572 Contracture, left ankle: Secondary | ICD-10-CM | POA: Diagnosis not present

## 2014-09-30 DIAGNOSIS — G40909 Epilepsy, unspecified, not intractable, without status epilepticus: Secondary | ICD-10-CM | POA: Diagnosis not present

## 2014-09-30 DIAGNOSIS — M24571 Contracture, right ankle: Secondary | ICD-10-CM | POA: Diagnosis not present

## 2014-10-01 DIAGNOSIS — M24561 Contracture, right knee: Secondary | ICD-10-CM | POA: Diagnosis not present

## 2014-10-01 DIAGNOSIS — R1311 Dysphagia, oral phase: Secondary | ICD-10-CM | POA: Diagnosis not present

## 2014-10-01 DIAGNOSIS — M24571 Contracture, right ankle: Secondary | ICD-10-CM | POA: Diagnosis not present

## 2014-10-01 DIAGNOSIS — G40909 Epilepsy, unspecified, not intractable, without status epilepticus: Secondary | ICD-10-CM | POA: Diagnosis not present

## 2014-10-01 DIAGNOSIS — M24562 Contracture, left knee: Secondary | ICD-10-CM | POA: Diagnosis not present

## 2014-10-01 DIAGNOSIS — M24572 Contracture, left ankle: Secondary | ICD-10-CM | POA: Diagnosis not present

## 2014-10-02 DIAGNOSIS — M24571 Contracture, right ankle: Secondary | ICD-10-CM | POA: Diagnosis not present

## 2014-10-02 DIAGNOSIS — L259 Unspecified contact dermatitis, unspecified cause: Secondary | ICD-10-CM | POA: Diagnosis not present

## 2014-10-02 DIAGNOSIS — M24562 Contracture, left knee: Secondary | ICD-10-CM | POA: Diagnosis not present

## 2014-10-02 DIAGNOSIS — L98499 Non-pressure chronic ulcer of skin of other sites with unspecified severity: Secondary | ICD-10-CM | POA: Diagnosis not present

## 2014-10-02 DIAGNOSIS — M24572 Contracture, left ankle: Secondary | ICD-10-CM | POA: Diagnosis not present

## 2014-10-02 DIAGNOSIS — R1311 Dysphagia, oral phase: Secondary | ICD-10-CM | POA: Diagnosis not present

## 2014-10-02 DIAGNOSIS — G40909 Epilepsy, unspecified, not intractable, without status epilepticus: Secondary | ICD-10-CM | POA: Diagnosis not present

## 2014-10-02 DIAGNOSIS — M24561 Contracture, right knee: Secondary | ICD-10-CM | POA: Diagnosis not present

## 2014-10-02 DIAGNOSIS — L501 Idiopathic urticaria: Secondary | ICD-10-CM | POA: Diagnosis not present

## 2014-10-05 DIAGNOSIS — R1311 Dysphagia, oral phase: Secondary | ICD-10-CM | POA: Diagnosis not present

## 2014-10-05 DIAGNOSIS — G40909 Epilepsy, unspecified, not intractable, without status epilepticus: Secondary | ICD-10-CM | POA: Diagnosis not present

## 2014-10-05 DIAGNOSIS — M24561 Contracture, right knee: Secondary | ICD-10-CM | POA: Diagnosis not present

## 2014-10-05 DIAGNOSIS — M24572 Contracture, left ankle: Secondary | ICD-10-CM | POA: Diagnosis not present

## 2014-10-05 DIAGNOSIS — M24571 Contracture, right ankle: Secondary | ICD-10-CM | POA: Diagnosis not present

## 2014-10-05 DIAGNOSIS — M24562 Contracture, left knee: Secondary | ICD-10-CM | POA: Diagnosis not present

## 2014-10-06 DIAGNOSIS — G40909 Epilepsy, unspecified, not intractable, without status epilepticus: Secondary | ICD-10-CM | POA: Diagnosis not present

## 2014-10-06 DIAGNOSIS — M24561 Contracture, right knee: Secondary | ICD-10-CM | POA: Diagnosis not present

## 2014-10-06 DIAGNOSIS — M24562 Contracture, left knee: Secondary | ICD-10-CM | POA: Diagnosis not present

## 2014-10-06 DIAGNOSIS — R1311 Dysphagia, oral phase: Secondary | ICD-10-CM | POA: Diagnosis not present

## 2014-10-06 DIAGNOSIS — M24572 Contracture, left ankle: Secondary | ICD-10-CM | POA: Diagnosis not present

## 2014-10-06 DIAGNOSIS — M24571 Contracture, right ankle: Secondary | ICD-10-CM | POA: Diagnosis not present

## 2014-10-07 DIAGNOSIS — R1311 Dysphagia, oral phase: Secondary | ICD-10-CM | POA: Diagnosis not present

## 2014-10-07 DIAGNOSIS — G40909 Epilepsy, unspecified, not intractable, without status epilepticus: Secondary | ICD-10-CM | POA: Diagnosis not present

## 2014-10-07 DIAGNOSIS — M24571 Contracture, right ankle: Secondary | ICD-10-CM | POA: Diagnosis not present

## 2014-10-07 DIAGNOSIS — M24572 Contracture, left ankle: Secondary | ICD-10-CM | POA: Diagnosis not present

## 2014-10-07 DIAGNOSIS — M24561 Contracture, right knee: Secondary | ICD-10-CM | POA: Diagnosis not present

## 2014-10-07 DIAGNOSIS — M24562 Contracture, left knee: Secondary | ICD-10-CM | POA: Diagnosis not present

## 2014-10-08 DIAGNOSIS — M24562 Contracture, left knee: Secondary | ICD-10-CM | POA: Diagnosis not present

## 2014-10-08 DIAGNOSIS — M24571 Contracture, right ankle: Secondary | ICD-10-CM | POA: Diagnosis not present

## 2014-10-08 DIAGNOSIS — R1311 Dysphagia, oral phase: Secondary | ICD-10-CM | POA: Diagnosis not present

## 2014-10-08 DIAGNOSIS — M24561 Contracture, right knee: Secondary | ICD-10-CM | POA: Diagnosis not present

## 2014-10-08 DIAGNOSIS — G40909 Epilepsy, unspecified, not intractable, without status epilepticus: Secondary | ICD-10-CM | POA: Diagnosis not present

## 2014-10-08 DIAGNOSIS — M24572 Contracture, left ankle: Secondary | ICD-10-CM | POA: Diagnosis not present

## 2014-10-09 DIAGNOSIS — M24561 Contracture, right knee: Secondary | ICD-10-CM | POA: Diagnosis not present

## 2014-10-09 DIAGNOSIS — M24572 Contracture, left ankle: Secondary | ICD-10-CM | POA: Diagnosis not present

## 2014-10-09 DIAGNOSIS — R1311 Dysphagia, oral phase: Secondary | ICD-10-CM | POA: Diagnosis not present

## 2014-10-09 DIAGNOSIS — M24562 Contracture, left knee: Secondary | ICD-10-CM | POA: Diagnosis not present

## 2014-10-09 DIAGNOSIS — M24571 Contracture, right ankle: Secondary | ICD-10-CM | POA: Diagnosis not present

## 2014-10-09 DIAGNOSIS — G40909 Epilepsy, unspecified, not intractable, without status epilepticus: Secondary | ICD-10-CM | POA: Diagnosis not present

## 2014-10-11 DIAGNOSIS — R1311 Dysphagia, oral phase: Secondary | ICD-10-CM | POA: Diagnosis not present

## 2014-10-11 DIAGNOSIS — M24561 Contracture, right knee: Secondary | ICD-10-CM | POA: Diagnosis not present

## 2014-10-11 DIAGNOSIS — M24571 Contracture, right ankle: Secondary | ICD-10-CM | POA: Diagnosis not present

## 2014-10-11 DIAGNOSIS — G40909 Epilepsy, unspecified, not intractable, without status epilepticus: Secondary | ICD-10-CM | POA: Diagnosis not present

## 2014-10-11 DIAGNOSIS — M24562 Contracture, left knee: Secondary | ICD-10-CM | POA: Diagnosis not present

## 2014-10-11 DIAGNOSIS — M24572 Contracture, left ankle: Secondary | ICD-10-CM | POA: Diagnosis not present

## 2014-10-12 DIAGNOSIS — M24562 Contracture, left knee: Secondary | ICD-10-CM | POA: Diagnosis not present

## 2014-10-12 DIAGNOSIS — R1311 Dysphagia, oral phase: Secondary | ICD-10-CM | POA: Diagnosis not present

## 2014-10-12 DIAGNOSIS — M24561 Contracture, right knee: Secondary | ICD-10-CM | POA: Diagnosis not present

## 2014-10-12 DIAGNOSIS — M24571 Contracture, right ankle: Secondary | ICD-10-CM | POA: Diagnosis not present

## 2014-10-12 DIAGNOSIS — M24572 Contracture, left ankle: Secondary | ICD-10-CM | POA: Diagnosis not present

## 2014-10-12 DIAGNOSIS — G40909 Epilepsy, unspecified, not intractable, without status epilepticus: Secondary | ICD-10-CM | POA: Diagnosis not present

## 2014-10-13 DIAGNOSIS — M24562 Contracture, left knee: Secondary | ICD-10-CM | POA: Diagnosis not present

## 2014-10-13 DIAGNOSIS — M24561 Contracture, right knee: Secondary | ICD-10-CM | POA: Diagnosis not present

## 2014-10-13 DIAGNOSIS — M24571 Contracture, right ankle: Secondary | ICD-10-CM | POA: Diagnosis not present

## 2014-10-13 DIAGNOSIS — R1311 Dysphagia, oral phase: Secondary | ICD-10-CM | POA: Diagnosis not present

## 2014-10-13 DIAGNOSIS — G40909 Epilepsy, unspecified, not intractable, without status epilepticus: Secondary | ICD-10-CM | POA: Diagnosis not present

## 2014-10-13 DIAGNOSIS — M24572 Contracture, left ankle: Secondary | ICD-10-CM | POA: Diagnosis not present

## 2014-10-14 DIAGNOSIS — G40909 Epilepsy, unspecified, not intractable, without status epilepticus: Secondary | ICD-10-CM | POA: Diagnosis not present

## 2014-10-14 DIAGNOSIS — M24562 Contracture, left knee: Secondary | ICD-10-CM | POA: Diagnosis not present

## 2014-10-14 DIAGNOSIS — R1311 Dysphagia, oral phase: Secondary | ICD-10-CM | POA: Diagnosis not present

## 2014-10-14 DIAGNOSIS — M24561 Contracture, right knee: Secondary | ICD-10-CM | POA: Diagnosis not present

## 2014-10-14 DIAGNOSIS — M24572 Contracture, left ankle: Secondary | ICD-10-CM | POA: Diagnosis not present

## 2014-10-14 DIAGNOSIS — M24571 Contracture, right ankle: Secondary | ICD-10-CM | POA: Diagnosis not present

## 2014-10-15 DIAGNOSIS — M24572 Contracture, left ankle: Secondary | ICD-10-CM | POA: Diagnosis not present

## 2014-10-15 DIAGNOSIS — M24562 Contracture, left knee: Secondary | ICD-10-CM | POA: Diagnosis not present

## 2014-10-15 DIAGNOSIS — M24561 Contracture, right knee: Secondary | ICD-10-CM | POA: Diagnosis not present

## 2014-10-15 DIAGNOSIS — G40909 Epilepsy, unspecified, not intractable, without status epilepticus: Secondary | ICD-10-CM | POA: Diagnosis not present

## 2014-10-15 DIAGNOSIS — M24571 Contracture, right ankle: Secondary | ICD-10-CM | POA: Diagnosis not present

## 2014-10-15 DIAGNOSIS — R1311 Dysphagia, oral phase: Secondary | ICD-10-CM | POA: Diagnosis not present

## 2014-10-16 DIAGNOSIS — L98419 Non-pressure chronic ulcer of buttock with unspecified severity: Secondary | ICD-10-CM | POA: Diagnosis not present

## 2014-10-16 DIAGNOSIS — L98499 Non-pressure chronic ulcer of skin of other sites with unspecified severity: Secondary | ICD-10-CM | POA: Diagnosis not present

## 2014-10-19 DIAGNOSIS — M24561 Contracture, right knee: Secondary | ICD-10-CM | POA: Diagnosis not present

## 2014-10-19 DIAGNOSIS — M24571 Contracture, right ankle: Secondary | ICD-10-CM | POA: Diagnosis not present

## 2014-10-19 DIAGNOSIS — R1311 Dysphagia, oral phase: Secondary | ICD-10-CM | POA: Diagnosis not present

## 2014-10-19 DIAGNOSIS — G40909 Epilepsy, unspecified, not intractable, without status epilepticus: Secondary | ICD-10-CM | POA: Diagnosis not present

## 2014-10-19 DIAGNOSIS — M24572 Contracture, left ankle: Secondary | ICD-10-CM | POA: Diagnosis not present

## 2014-10-19 DIAGNOSIS — M24562 Contracture, left knee: Secondary | ICD-10-CM | POA: Diagnosis not present

## 2014-10-21 DIAGNOSIS — M24571 Contracture, right ankle: Secondary | ICD-10-CM | POA: Diagnosis not present

## 2014-10-21 DIAGNOSIS — G40909 Epilepsy, unspecified, not intractable, without status epilepticus: Secondary | ICD-10-CM | POA: Diagnosis not present

## 2014-10-21 DIAGNOSIS — M24562 Contracture, left knee: Secondary | ICD-10-CM | POA: Diagnosis not present

## 2014-10-21 DIAGNOSIS — I739 Peripheral vascular disease, unspecified: Secondary | ICD-10-CM | POA: Diagnosis not present

## 2014-10-21 DIAGNOSIS — M24561 Contracture, right knee: Secondary | ICD-10-CM | POA: Diagnosis not present

## 2014-10-21 DIAGNOSIS — R1311 Dysphagia, oral phase: Secondary | ICD-10-CM | POA: Diagnosis not present

## 2014-10-21 DIAGNOSIS — M24572 Contracture, left ankle: Secondary | ICD-10-CM | POA: Diagnosis not present

## 2014-10-22 DIAGNOSIS — M24562 Contracture, left knee: Secondary | ICD-10-CM | POA: Diagnosis not present

## 2014-10-22 DIAGNOSIS — R1311 Dysphagia, oral phase: Secondary | ICD-10-CM | POA: Diagnosis not present

## 2014-10-22 DIAGNOSIS — M24571 Contracture, right ankle: Secondary | ICD-10-CM | POA: Diagnosis not present

## 2014-10-22 DIAGNOSIS — G40909 Epilepsy, unspecified, not intractable, without status epilepticus: Secondary | ICD-10-CM | POA: Diagnosis not present

## 2014-10-22 DIAGNOSIS — M24561 Contracture, right knee: Secondary | ICD-10-CM | POA: Diagnosis not present

## 2014-10-22 DIAGNOSIS — M24572 Contracture, left ankle: Secondary | ICD-10-CM | POA: Diagnosis not present

## 2014-10-23 DIAGNOSIS — G40909 Epilepsy, unspecified, not intractable, without status epilepticus: Secondary | ICD-10-CM | POA: Diagnosis not present

## 2014-10-23 DIAGNOSIS — M24561 Contracture, right knee: Secondary | ICD-10-CM | POA: Diagnosis not present

## 2014-10-23 DIAGNOSIS — M24571 Contracture, right ankle: Secondary | ICD-10-CM | POA: Diagnosis not present

## 2014-10-23 DIAGNOSIS — M24572 Contracture, left ankle: Secondary | ICD-10-CM | POA: Diagnosis not present

## 2014-10-23 DIAGNOSIS — M24562 Contracture, left knee: Secondary | ICD-10-CM | POA: Diagnosis not present

## 2014-10-23 DIAGNOSIS — R1311 Dysphagia, oral phase: Secondary | ICD-10-CM | POA: Diagnosis not present

## 2014-10-24 DIAGNOSIS — M24571 Contracture, right ankle: Secondary | ICD-10-CM | POA: Diagnosis not present

## 2014-10-24 DIAGNOSIS — G40909 Epilepsy, unspecified, not intractable, without status epilepticus: Secondary | ICD-10-CM | POA: Diagnosis not present

## 2014-10-24 DIAGNOSIS — M24572 Contracture, left ankle: Secondary | ICD-10-CM | POA: Diagnosis not present

## 2014-10-24 DIAGNOSIS — M24561 Contracture, right knee: Secondary | ICD-10-CM | POA: Diagnosis not present

## 2014-10-24 DIAGNOSIS — R1311 Dysphagia, oral phase: Secondary | ICD-10-CM | POA: Diagnosis not present

## 2014-10-24 DIAGNOSIS — M24562 Contracture, left knee: Secondary | ICD-10-CM | POA: Diagnosis not present

## 2014-10-26 DIAGNOSIS — G40909 Epilepsy, unspecified, not intractable, without status epilepticus: Secondary | ICD-10-CM | POA: Diagnosis not present

## 2014-10-26 DIAGNOSIS — M24572 Contracture, left ankle: Secondary | ICD-10-CM | POA: Diagnosis not present

## 2014-10-26 DIAGNOSIS — M24561 Contracture, right knee: Secondary | ICD-10-CM | POA: Diagnosis not present

## 2014-10-26 DIAGNOSIS — R1311 Dysphagia, oral phase: Secondary | ICD-10-CM | POA: Diagnosis not present

## 2014-10-26 DIAGNOSIS — M24562 Contracture, left knee: Secondary | ICD-10-CM | POA: Diagnosis not present

## 2014-10-26 DIAGNOSIS — M24571 Contracture, right ankle: Secondary | ICD-10-CM | POA: Diagnosis not present

## 2014-10-27 DIAGNOSIS — M24562 Contracture, left knee: Secondary | ICD-10-CM | POA: Diagnosis not present

## 2014-10-27 DIAGNOSIS — M24561 Contracture, right knee: Secondary | ICD-10-CM | POA: Diagnosis not present

## 2014-10-27 DIAGNOSIS — M24571 Contracture, right ankle: Secondary | ICD-10-CM | POA: Diagnosis not present

## 2014-10-27 DIAGNOSIS — M24572 Contracture, left ankle: Secondary | ICD-10-CM | POA: Diagnosis not present

## 2014-10-27 DIAGNOSIS — G40909 Epilepsy, unspecified, not intractable, without status epilepticus: Secondary | ICD-10-CM | POA: Diagnosis not present

## 2014-10-27 DIAGNOSIS — R1311 Dysphagia, oral phase: Secondary | ICD-10-CM | POA: Diagnosis not present

## 2014-10-28 DIAGNOSIS — L8990 Pressure ulcer of unspecified site, unspecified stage: Secondary | ICD-10-CM | POA: Diagnosis not present

## 2014-10-28 DIAGNOSIS — G40909 Epilepsy, unspecified, not intractable, without status epilepticus: Secondary | ICD-10-CM | POA: Diagnosis not present

## 2014-10-28 DIAGNOSIS — E559 Vitamin D deficiency, unspecified: Secondary | ICD-10-CM | POA: Diagnosis not present

## 2014-10-28 DIAGNOSIS — I2699 Other pulmonary embolism without acute cor pulmonale: Secondary | ICD-10-CM | POA: Diagnosis not present

## 2014-10-28 DIAGNOSIS — M24561 Contracture, right knee: Secondary | ICD-10-CM | POA: Diagnosis not present

## 2014-10-28 DIAGNOSIS — M24572 Contracture, left ankle: Secondary | ICD-10-CM | POA: Diagnosis not present

## 2014-10-28 DIAGNOSIS — Z79899 Other long term (current) drug therapy: Secondary | ICD-10-CM | POA: Diagnosis not present

## 2014-10-28 DIAGNOSIS — I8010 Phlebitis and thrombophlebitis of unspecified femoral vein: Secondary | ICD-10-CM | POA: Diagnosis not present

## 2014-10-28 DIAGNOSIS — M24562 Contracture, left knee: Secondary | ICD-10-CM | POA: Diagnosis not present

## 2014-10-28 DIAGNOSIS — Z9889 Other specified postprocedural states: Secondary | ICD-10-CM | POA: Diagnosis not present

## 2014-10-28 DIAGNOSIS — R1311 Dysphagia, oral phase: Secondary | ICD-10-CM | POA: Diagnosis not present

## 2014-10-28 DIAGNOSIS — M24571 Contracture, right ankle: Secondary | ICD-10-CM | POA: Diagnosis not present

## 2014-10-28 DIAGNOSIS — R7989 Other specified abnormal findings of blood chemistry: Secondary | ICD-10-CM | POA: Diagnosis not present

## 2014-10-29 DIAGNOSIS — M24562 Contracture, left knee: Secondary | ICD-10-CM | POA: Diagnosis not present

## 2014-10-29 DIAGNOSIS — M24572 Contracture, left ankle: Secondary | ICD-10-CM | POA: Diagnosis not present

## 2014-10-29 DIAGNOSIS — R1311 Dysphagia, oral phase: Secondary | ICD-10-CM | POA: Diagnosis not present

## 2014-10-29 DIAGNOSIS — G40909 Epilepsy, unspecified, not intractable, without status epilepticus: Secondary | ICD-10-CM | POA: Diagnosis not present

## 2014-10-29 DIAGNOSIS — M24571 Contracture, right ankle: Secondary | ICD-10-CM | POA: Diagnosis not present

## 2014-10-29 DIAGNOSIS — M24561 Contracture, right knee: Secondary | ICD-10-CM | POA: Diagnosis not present

## 2014-10-30 DIAGNOSIS — M24561 Contracture, right knee: Secondary | ICD-10-CM | POA: Diagnosis not present

## 2014-10-30 DIAGNOSIS — M24571 Contracture, right ankle: Secondary | ICD-10-CM | POA: Diagnosis not present

## 2014-10-30 DIAGNOSIS — M24572 Contracture, left ankle: Secondary | ICD-10-CM | POA: Diagnosis not present

## 2014-10-30 DIAGNOSIS — R1311 Dysphagia, oral phase: Secondary | ICD-10-CM | POA: Diagnosis not present

## 2014-10-30 DIAGNOSIS — G40909 Epilepsy, unspecified, not intractable, without status epilepticus: Secondary | ICD-10-CM | POA: Diagnosis not present

## 2014-10-30 DIAGNOSIS — M24562 Contracture, left knee: Secondary | ICD-10-CM | POA: Diagnosis not present

## 2014-10-30 DIAGNOSIS — K9420 Gastrostomy complication, unspecified: Secondary | ICD-10-CM | POA: Diagnosis not present

## 2014-11-02 DIAGNOSIS — M24572 Contracture, left ankle: Secondary | ICD-10-CM | POA: Diagnosis not present

## 2014-11-02 DIAGNOSIS — R1311 Dysphagia, oral phase: Secondary | ICD-10-CM | POA: Diagnosis not present

## 2014-11-02 DIAGNOSIS — M24562 Contracture, left knee: Secondary | ICD-10-CM | POA: Diagnosis not present

## 2014-11-02 DIAGNOSIS — G40909 Epilepsy, unspecified, not intractable, without status epilepticus: Secondary | ICD-10-CM | POA: Diagnosis not present

## 2014-11-02 DIAGNOSIS — M24571 Contracture, right ankle: Secondary | ICD-10-CM | POA: Diagnosis not present

## 2014-11-02 DIAGNOSIS — M24561 Contracture, right knee: Secondary | ICD-10-CM | POA: Diagnosis not present

## 2014-11-03 DIAGNOSIS — M24572 Contracture, left ankle: Secondary | ICD-10-CM | POA: Diagnosis not present

## 2014-11-03 DIAGNOSIS — M24561 Contracture, right knee: Secondary | ICD-10-CM | POA: Diagnosis not present

## 2014-11-03 DIAGNOSIS — R1311 Dysphagia, oral phase: Secondary | ICD-10-CM | POA: Diagnosis not present

## 2014-11-03 DIAGNOSIS — G40909 Epilepsy, unspecified, not intractable, without status epilepticus: Secondary | ICD-10-CM | POA: Diagnosis not present

## 2014-11-03 DIAGNOSIS — M24562 Contracture, left knee: Secondary | ICD-10-CM | POA: Diagnosis not present

## 2014-11-03 DIAGNOSIS — M24571 Contracture, right ankle: Secondary | ICD-10-CM | POA: Diagnosis not present

## 2014-11-05 DIAGNOSIS — G40909 Epilepsy, unspecified, not intractable, without status epilepticus: Secondary | ICD-10-CM | POA: Diagnosis not present

## 2014-11-05 DIAGNOSIS — M24562 Contracture, left knee: Secondary | ICD-10-CM | POA: Diagnosis not present

## 2014-11-05 DIAGNOSIS — M24561 Contracture, right knee: Secondary | ICD-10-CM | POA: Diagnosis not present

## 2014-11-05 DIAGNOSIS — M24572 Contracture, left ankle: Secondary | ICD-10-CM | POA: Diagnosis not present

## 2014-11-05 DIAGNOSIS — R1311 Dysphagia, oral phase: Secondary | ICD-10-CM | POA: Diagnosis not present

## 2014-11-05 DIAGNOSIS — M24571 Contracture, right ankle: Secondary | ICD-10-CM | POA: Diagnosis not present

## 2014-11-06 DIAGNOSIS — L89154 Pressure ulcer of sacral region, stage 4: Secondary | ICD-10-CM | POA: Diagnosis not present

## 2014-11-06 DIAGNOSIS — L98499 Non-pressure chronic ulcer of skin of other sites with unspecified severity: Secondary | ICD-10-CM | POA: Diagnosis not present

## 2014-11-09 DIAGNOSIS — G40909 Epilepsy, unspecified, not intractable, without status epilepticus: Secondary | ICD-10-CM | POA: Diagnosis not present

## 2014-11-09 DIAGNOSIS — M24571 Contracture, right ankle: Secondary | ICD-10-CM | POA: Diagnosis not present

## 2014-11-09 DIAGNOSIS — M24562 Contracture, left knee: Secondary | ICD-10-CM | POA: Diagnosis not present

## 2014-11-09 DIAGNOSIS — M24561 Contracture, right knee: Secondary | ICD-10-CM | POA: Diagnosis not present

## 2014-11-09 DIAGNOSIS — R1311 Dysphagia, oral phase: Secondary | ICD-10-CM | POA: Diagnosis not present

## 2014-11-09 DIAGNOSIS — M24572 Contracture, left ankle: Secondary | ICD-10-CM | POA: Diagnosis not present

## 2014-11-11 DIAGNOSIS — M24561 Contracture, right knee: Secondary | ICD-10-CM | POA: Diagnosis not present

## 2014-11-11 DIAGNOSIS — M24572 Contracture, left ankle: Secondary | ICD-10-CM | POA: Diagnosis not present

## 2014-11-11 DIAGNOSIS — M24562 Contracture, left knee: Secondary | ICD-10-CM | POA: Diagnosis not present

## 2014-11-11 DIAGNOSIS — R1311 Dysphagia, oral phase: Secondary | ICD-10-CM | POA: Diagnosis not present

## 2014-11-11 DIAGNOSIS — M24571 Contracture, right ankle: Secondary | ICD-10-CM | POA: Diagnosis not present

## 2014-11-11 DIAGNOSIS — G40909 Epilepsy, unspecified, not intractable, without status epilepticus: Secondary | ICD-10-CM | POA: Diagnosis not present

## 2014-11-13 DIAGNOSIS — M24561 Contracture, right knee: Secondary | ICD-10-CM | POA: Diagnosis not present

## 2014-11-13 DIAGNOSIS — M24572 Contracture, left ankle: Secondary | ICD-10-CM | POA: Diagnosis not present

## 2014-11-13 DIAGNOSIS — G40909 Epilepsy, unspecified, not intractable, without status epilepticus: Secondary | ICD-10-CM | POA: Diagnosis not present

## 2014-11-13 DIAGNOSIS — M24571 Contracture, right ankle: Secondary | ICD-10-CM | POA: Diagnosis not present

## 2014-11-13 DIAGNOSIS — M24562 Contracture, left knee: Secondary | ICD-10-CM | POA: Diagnosis not present

## 2014-11-13 DIAGNOSIS — R1311 Dysphagia, oral phase: Secondary | ICD-10-CM | POA: Diagnosis not present

## 2014-11-17 DIAGNOSIS — M24572 Contracture, left ankle: Secondary | ICD-10-CM | POA: Diagnosis not present

## 2014-11-17 DIAGNOSIS — M24561 Contracture, right knee: Secondary | ICD-10-CM | POA: Diagnosis not present

## 2014-11-17 DIAGNOSIS — G40909 Epilepsy, unspecified, not intractable, without status epilepticus: Secondary | ICD-10-CM | POA: Diagnosis not present

## 2014-11-17 DIAGNOSIS — M24571 Contracture, right ankle: Secondary | ICD-10-CM | POA: Diagnosis not present

## 2014-11-17 DIAGNOSIS — R1311 Dysphagia, oral phase: Secondary | ICD-10-CM | POA: Diagnosis not present

## 2014-11-17 DIAGNOSIS — M24562 Contracture, left knee: Secondary | ICD-10-CM | POA: Diagnosis not present

## 2014-11-18 DIAGNOSIS — M24571 Contracture, right ankle: Secondary | ICD-10-CM | POA: Diagnosis not present

## 2014-11-18 DIAGNOSIS — M24572 Contracture, left ankle: Secondary | ICD-10-CM | POA: Diagnosis not present

## 2014-11-18 DIAGNOSIS — M24561 Contracture, right knee: Secondary | ICD-10-CM | POA: Diagnosis not present

## 2014-11-18 DIAGNOSIS — G40909 Epilepsy, unspecified, not intractable, without status epilepticus: Secondary | ICD-10-CM | POA: Diagnosis not present

## 2014-11-18 DIAGNOSIS — M24562 Contracture, left knee: Secondary | ICD-10-CM | POA: Diagnosis not present

## 2014-11-18 DIAGNOSIS — R1311 Dysphagia, oral phase: Secondary | ICD-10-CM | POA: Diagnosis not present

## 2014-11-20 DIAGNOSIS — M24571 Contracture, right ankle: Secondary | ICD-10-CM | POA: Diagnosis not present

## 2014-11-20 DIAGNOSIS — G40909 Epilepsy, unspecified, not intractable, without status epilepticus: Secondary | ICD-10-CM | POA: Diagnosis not present

## 2014-11-20 DIAGNOSIS — R1311 Dysphagia, oral phase: Secondary | ICD-10-CM | POA: Diagnosis not present

## 2014-11-20 DIAGNOSIS — M24572 Contracture, left ankle: Secondary | ICD-10-CM | POA: Diagnosis not present

## 2014-11-20 DIAGNOSIS — M24562 Contracture, left knee: Secondary | ICD-10-CM | POA: Diagnosis not present

## 2014-11-20 DIAGNOSIS — M24561 Contracture, right knee: Secondary | ICD-10-CM | POA: Diagnosis not present

## 2014-11-23 DIAGNOSIS — M24562 Contracture, left knee: Secondary | ICD-10-CM | POA: Diagnosis not present

## 2014-11-23 DIAGNOSIS — R1311 Dysphagia, oral phase: Secondary | ICD-10-CM | POA: Diagnosis not present

## 2014-11-23 DIAGNOSIS — M24561 Contracture, right knee: Secondary | ICD-10-CM | POA: Diagnosis not present

## 2014-11-23 DIAGNOSIS — M24571 Contracture, right ankle: Secondary | ICD-10-CM | POA: Diagnosis not present

## 2014-11-23 DIAGNOSIS — D638 Anemia in other chronic diseases classified elsewhere: Secondary | ICD-10-CM | POA: Diagnosis not present

## 2014-11-23 DIAGNOSIS — G40909 Epilepsy, unspecified, not intractable, without status epilepticus: Secondary | ICD-10-CM | POA: Diagnosis not present

## 2014-11-23 DIAGNOSIS — M24572 Contracture, left ankle: Secondary | ICD-10-CM | POA: Diagnosis not present

## 2014-11-25 DIAGNOSIS — M24561 Contracture, right knee: Secondary | ICD-10-CM | POA: Diagnosis not present

## 2014-11-25 DIAGNOSIS — R1311 Dysphagia, oral phase: Secondary | ICD-10-CM | POA: Diagnosis not present

## 2014-11-25 DIAGNOSIS — M24572 Contracture, left ankle: Secondary | ICD-10-CM | POA: Diagnosis not present

## 2014-11-25 DIAGNOSIS — M24562 Contracture, left knee: Secondary | ICD-10-CM | POA: Diagnosis not present

## 2014-11-25 DIAGNOSIS — M24571 Contracture, right ankle: Secondary | ICD-10-CM | POA: Diagnosis not present

## 2014-11-25 DIAGNOSIS — G40909 Epilepsy, unspecified, not intractable, without status epilepticus: Secondary | ICD-10-CM | POA: Diagnosis not present

## 2014-11-26 DIAGNOSIS — L98419 Non-pressure chronic ulcer of buttock with unspecified severity: Secondary | ICD-10-CM | POA: Diagnosis not present

## 2014-11-26 DIAGNOSIS — L89154 Pressure ulcer of sacral region, stage 4: Secondary | ICD-10-CM | POA: Diagnosis not present

## 2014-11-27 DIAGNOSIS — R1311 Dysphagia, oral phase: Secondary | ICD-10-CM | POA: Diagnosis not present

## 2014-11-27 DIAGNOSIS — G40909 Epilepsy, unspecified, not intractable, without status epilepticus: Secondary | ICD-10-CM | POA: Diagnosis not present

## 2014-11-27 DIAGNOSIS — M24562 Contracture, left knee: Secondary | ICD-10-CM | POA: Diagnosis not present

## 2014-11-27 DIAGNOSIS — M24561 Contracture, right knee: Secondary | ICD-10-CM | POA: Diagnosis not present

## 2014-11-27 DIAGNOSIS — M24572 Contracture, left ankle: Secondary | ICD-10-CM | POA: Diagnosis not present

## 2014-11-27 DIAGNOSIS — M24571 Contracture, right ankle: Secondary | ICD-10-CM | POA: Diagnosis not present

## 2014-12-01 DIAGNOSIS — M24571 Contracture, right ankle: Secondary | ICD-10-CM | POA: Diagnosis not present

## 2014-12-01 DIAGNOSIS — M24572 Contracture, left ankle: Secondary | ICD-10-CM | POA: Diagnosis not present

## 2014-12-01 DIAGNOSIS — G40909 Epilepsy, unspecified, not intractable, without status epilepticus: Secondary | ICD-10-CM | POA: Diagnosis not present

## 2014-12-01 DIAGNOSIS — R1311 Dysphagia, oral phase: Secondary | ICD-10-CM | POA: Diagnosis not present

## 2014-12-01 DIAGNOSIS — M24562 Contracture, left knee: Secondary | ICD-10-CM | POA: Diagnosis not present

## 2014-12-01 DIAGNOSIS — M24561 Contracture, right knee: Secondary | ICD-10-CM | POA: Diagnosis not present

## 2014-12-02 DIAGNOSIS — R1311 Dysphagia, oral phase: Secondary | ICD-10-CM | POA: Diagnosis not present

## 2014-12-02 DIAGNOSIS — M24562 Contracture, left knee: Secondary | ICD-10-CM | POA: Diagnosis not present

## 2014-12-02 DIAGNOSIS — G40909 Epilepsy, unspecified, not intractable, without status epilepticus: Secondary | ICD-10-CM | POA: Diagnosis not present

## 2014-12-02 DIAGNOSIS — M24571 Contracture, right ankle: Secondary | ICD-10-CM | POA: Diagnosis not present

## 2014-12-02 DIAGNOSIS — M24572 Contracture, left ankle: Secondary | ICD-10-CM | POA: Diagnosis not present

## 2014-12-02 DIAGNOSIS — M24561 Contracture, right knee: Secondary | ICD-10-CM | POA: Diagnosis not present

## 2014-12-04 DIAGNOSIS — M24561 Contracture, right knee: Secondary | ICD-10-CM | POA: Diagnosis not present

## 2014-12-04 DIAGNOSIS — M24571 Contracture, right ankle: Secondary | ICD-10-CM | POA: Diagnosis not present

## 2014-12-04 DIAGNOSIS — G40909 Epilepsy, unspecified, not intractable, without status epilepticus: Secondary | ICD-10-CM | POA: Diagnosis not present

## 2014-12-04 DIAGNOSIS — M24562 Contracture, left knee: Secondary | ICD-10-CM | POA: Diagnosis not present

## 2014-12-04 DIAGNOSIS — R1311 Dysphagia, oral phase: Secondary | ICD-10-CM | POA: Diagnosis not present

## 2014-12-04 DIAGNOSIS — M24572 Contracture, left ankle: Secondary | ICD-10-CM | POA: Diagnosis not present

## 2014-12-07 DIAGNOSIS — R1311 Dysphagia, oral phase: Secondary | ICD-10-CM | POA: Diagnosis not present

## 2014-12-07 DIAGNOSIS — M24561 Contracture, right knee: Secondary | ICD-10-CM | POA: Diagnosis not present

## 2014-12-07 DIAGNOSIS — M24571 Contracture, right ankle: Secondary | ICD-10-CM | POA: Diagnosis not present

## 2014-12-07 DIAGNOSIS — M24572 Contracture, left ankle: Secondary | ICD-10-CM | POA: Diagnosis not present

## 2014-12-07 DIAGNOSIS — M24562 Contracture, left knee: Secondary | ICD-10-CM | POA: Diagnosis not present

## 2014-12-07 DIAGNOSIS — G40909 Epilepsy, unspecified, not intractable, without status epilepticus: Secondary | ICD-10-CM | POA: Diagnosis not present

## 2014-12-09 DIAGNOSIS — G40909 Epilepsy, unspecified, not intractable, without status epilepticus: Secondary | ICD-10-CM | POA: Diagnosis not present

## 2014-12-09 DIAGNOSIS — M24571 Contracture, right ankle: Secondary | ICD-10-CM | POA: Diagnosis not present

## 2014-12-09 DIAGNOSIS — M24572 Contracture, left ankle: Secondary | ICD-10-CM | POA: Diagnosis not present

## 2014-12-09 DIAGNOSIS — R1311 Dysphagia, oral phase: Secondary | ICD-10-CM | POA: Diagnosis not present

## 2014-12-09 DIAGNOSIS — M24561 Contracture, right knee: Secondary | ICD-10-CM | POA: Diagnosis not present

## 2014-12-09 DIAGNOSIS — M24562 Contracture, left knee: Secondary | ICD-10-CM | POA: Diagnosis not present

## 2014-12-10 DIAGNOSIS — L97119 Non-pressure chronic ulcer of right thigh with unspecified severity: Secondary | ICD-10-CM | POA: Diagnosis not present

## 2014-12-11 DIAGNOSIS — M24562 Contracture, left knee: Secondary | ICD-10-CM | POA: Diagnosis not present

## 2014-12-11 DIAGNOSIS — M24571 Contracture, right ankle: Secondary | ICD-10-CM | POA: Diagnosis not present

## 2014-12-11 DIAGNOSIS — R1311 Dysphagia, oral phase: Secondary | ICD-10-CM | POA: Diagnosis not present

## 2014-12-11 DIAGNOSIS — G40909 Epilepsy, unspecified, not intractable, without status epilepticus: Secondary | ICD-10-CM | POA: Diagnosis not present

## 2014-12-11 DIAGNOSIS — M24561 Contracture, right knee: Secondary | ICD-10-CM | POA: Diagnosis not present

## 2014-12-11 DIAGNOSIS — M24572 Contracture, left ankle: Secondary | ICD-10-CM | POA: Diagnosis not present

## 2014-12-14 DIAGNOSIS — R1311 Dysphagia, oral phase: Secondary | ICD-10-CM | POA: Diagnosis not present

## 2014-12-14 DIAGNOSIS — M24561 Contracture, right knee: Secondary | ICD-10-CM | POA: Diagnosis not present

## 2014-12-14 DIAGNOSIS — M24562 Contracture, left knee: Secondary | ICD-10-CM | POA: Diagnosis not present

## 2014-12-14 DIAGNOSIS — M24571 Contracture, right ankle: Secondary | ICD-10-CM | POA: Diagnosis not present

## 2014-12-14 DIAGNOSIS — M24572 Contracture, left ankle: Secondary | ICD-10-CM | POA: Diagnosis not present

## 2014-12-14 DIAGNOSIS — G40909 Epilepsy, unspecified, not intractable, without status epilepticus: Secondary | ICD-10-CM | POA: Diagnosis not present

## 2014-12-15 DIAGNOSIS — E039 Hypothyroidism, unspecified: Secondary | ICD-10-CM | POA: Diagnosis not present

## 2014-12-15 DIAGNOSIS — E559 Vitamin D deficiency, unspecified: Secondary | ICD-10-CM | POA: Diagnosis not present

## 2014-12-15 DIAGNOSIS — E538 Deficiency of other specified B group vitamins: Secondary | ICD-10-CM | POA: Diagnosis not present

## 2014-12-16 DIAGNOSIS — R1311 Dysphagia, oral phase: Secondary | ICD-10-CM | POA: Diagnosis not present

## 2014-12-16 DIAGNOSIS — G40909 Epilepsy, unspecified, not intractable, without status epilepticus: Secondary | ICD-10-CM | POA: Diagnosis not present

## 2014-12-16 DIAGNOSIS — M24561 Contracture, right knee: Secondary | ICD-10-CM | POA: Diagnosis not present

## 2014-12-16 DIAGNOSIS — M24562 Contracture, left knee: Secondary | ICD-10-CM | POA: Diagnosis not present

## 2014-12-16 DIAGNOSIS — M24571 Contracture, right ankle: Secondary | ICD-10-CM | POA: Diagnosis not present

## 2014-12-16 DIAGNOSIS — M24572 Contracture, left ankle: Secondary | ICD-10-CM | POA: Diagnosis not present

## 2014-12-18 DIAGNOSIS — G40909 Epilepsy, unspecified, not intractable, without status epilepticus: Secondary | ICD-10-CM | POA: Diagnosis not present

## 2014-12-18 DIAGNOSIS — R1311 Dysphagia, oral phase: Secondary | ICD-10-CM | POA: Diagnosis not present

## 2014-12-18 DIAGNOSIS — M24571 Contracture, right ankle: Secondary | ICD-10-CM | POA: Diagnosis not present

## 2014-12-18 DIAGNOSIS — M24572 Contracture, left ankle: Secondary | ICD-10-CM | POA: Diagnosis not present

## 2014-12-18 DIAGNOSIS — M24561 Contracture, right knee: Secondary | ICD-10-CM | POA: Diagnosis not present

## 2014-12-18 DIAGNOSIS — M24562 Contracture, left knee: Secondary | ICD-10-CM | POA: Diagnosis not present

## 2014-12-21 DIAGNOSIS — G40909 Epilepsy, unspecified, not intractable, without status epilepticus: Secondary | ICD-10-CM | POA: Diagnosis not present

## 2014-12-21 DIAGNOSIS — M24561 Contracture, right knee: Secondary | ICD-10-CM | POA: Diagnosis not present

## 2014-12-21 DIAGNOSIS — M24572 Contracture, left ankle: Secondary | ICD-10-CM | POA: Diagnosis not present

## 2014-12-21 DIAGNOSIS — M24562 Contracture, left knee: Secondary | ICD-10-CM | POA: Diagnosis not present

## 2014-12-21 DIAGNOSIS — R1311 Dysphagia, oral phase: Secondary | ICD-10-CM | POA: Diagnosis not present

## 2014-12-21 DIAGNOSIS — M24571 Contracture, right ankle: Secondary | ICD-10-CM | POA: Diagnosis not present

## 2014-12-22 DIAGNOSIS — R1311 Dysphagia, oral phase: Secondary | ICD-10-CM | POA: Diagnosis not present

## 2014-12-22 DIAGNOSIS — M24562 Contracture, left knee: Secondary | ICD-10-CM | POA: Diagnosis not present

## 2014-12-22 DIAGNOSIS — M24572 Contracture, left ankle: Secondary | ICD-10-CM | POA: Diagnosis not present

## 2014-12-22 DIAGNOSIS — G40909 Epilepsy, unspecified, not intractable, without status epilepticus: Secondary | ICD-10-CM | POA: Diagnosis not present

## 2014-12-22 DIAGNOSIS — M24561 Contracture, right knee: Secondary | ICD-10-CM | POA: Diagnosis not present

## 2014-12-22 DIAGNOSIS — M24571 Contracture, right ankle: Secondary | ICD-10-CM | POA: Diagnosis not present

## 2014-12-23 DIAGNOSIS — M79671 Pain in right foot: Secondary | ICD-10-CM | POA: Diagnosis not present

## 2014-12-23 DIAGNOSIS — B351 Tinea unguium: Secondary | ICD-10-CM | POA: Diagnosis not present

## 2014-12-23 DIAGNOSIS — M79672 Pain in left foot: Secondary | ICD-10-CM | POA: Diagnosis not present

## 2014-12-24 DIAGNOSIS — M6281 Muscle weakness (generalized): Secondary | ICD-10-CM | POA: Diagnosis not present

## 2014-12-24 DIAGNOSIS — M6380 Disorders of muscle in diseases classified elsewhere, unspecified site: Secondary | ICD-10-CM | POA: Diagnosis not present

## 2014-12-24 DIAGNOSIS — M24562 Contracture, left knee: Secondary | ICD-10-CM | POA: Diagnosis not present

## 2014-12-24 DIAGNOSIS — G40909 Epilepsy, unspecified, not intractable, without status epilepticus: Secondary | ICD-10-CM | POA: Diagnosis not present

## 2014-12-24 DIAGNOSIS — M24571 Contracture, right ankle: Secondary | ICD-10-CM | POA: Diagnosis not present

## 2014-12-24 DIAGNOSIS — L97119 Non-pressure chronic ulcer of right thigh with unspecified severity: Secondary | ICD-10-CM | POA: Diagnosis not present

## 2014-12-24 DIAGNOSIS — M24561 Contracture, right knee: Secondary | ICD-10-CM | POA: Diagnosis not present

## 2014-12-24 DIAGNOSIS — L89154 Pressure ulcer of sacral region, stage 4: Secondary | ICD-10-CM | POA: Diagnosis not present

## 2014-12-24 DIAGNOSIS — R278 Other lack of coordination: Secondary | ICD-10-CM | POA: Diagnosis not present

## 2014-12-24 DIAGNOSIS — R1311 Dysphagia, oral phase: Secondary | ICD-10-CM | POA: Diagnosis not present

## 2014-12-24 DIAGNOSIS — M24572 Contracture, left ankle: Secondary | ICD-10-CM | POA: Diagnosis not present

## 2014-12-29 DIAGNOSIS — M24571 Contracture, right ankle: Secondary | ICD-10-CM | POA: Diagnosis not present

## 2014-12-29 DIAGNOSIS — M24562 Contracture, left knee: Secondary | ICD-10-CM | POA: Diagnosis not present

## 2014-12-29 DIAGNOSIS — L03039 Cellulitis of unspecified toe: Secondary | ICD-10-CM | POA: Diagnosis not present

## 2014-12-29 DIAGNOSIS — R1311 Dysphagia, oral phase: Secondary | ICD-10-CM | POA: Diagnosis not present

## 2014-12-29 DIAGNOSIS — E538 Deficiency of other specified B group vitamins: Secondary | ICD-10-CM | POA: Diagnosis not present

## 2014-12-29 DIAGNOSIS — M6281 Muscle weakness (generalized): Secondary | ICD-10-CM | POA: Diagnosis not present

## 2014-12-29 DIAGNOSIS — R278 Other lack of coordination: Secondary | ICD-10-CM | POA: Diagnosis not present

## 2014-12-29 DIAGNOSIS — M24561 Contracture, right knee: Secondary | ICD-10-CM | POA: Diagnosis not present

## 2014-12-29 DIAGNOSIS — M24572 Contracture, left ankle: Secondary | ICD-10-CM | POA: Diagnosis not present

## 2014-12-29 DIAGNOSIS — M6380 Disorders of muscle in diseases classified elsewhere, unspecified site: Secondary | ICD-10-CM | POA: Diagnosis not present

## 2014-12-29 DIAGNOSIS — G40909 Epilepsy, unspecified, not intractable, without status epilepticus: Secondary | ICD-10-CM | POA: Diagnosis not present

## 2014-12-30 DIAGNOSIS — M6281 Muscle weakness (generalized): Secondary | ICD-10-CM | POA: Diagnosis not present

## 2014-12-30 DIAGNOSIS — R8290 Unspecified abnormal findings in urine: Secondary | ICD-10-CM | POA: Diagnosis not present

## 2014-12-30 DIAGNOSIS — M6380 Disorders of muscle in diseases classified elsewhere, unspecified site: Secondary | ICD-10-CM | POA: Diagnosis not present

## 2014-12-30 DIAGNOSIS — M24571 Contracture, right ankle: Secondary | ICD-10-CM | POA: Diagnosis not present

## 2014-12-30 DIAGNOSIS — G40909 Epilepsy, unspecified, not intractable, without status epilepticus: Secondary | ICD-10-CM | POA: Diagnosis not present

## 2014-12-30 DIAGNOSIS — R278 Other lack of coordination: Secondary | ICD-10-CM | POA: Diagnosis not present

## 2014-12-30 DIAGNOSIS — M24562 Contracture, left knee: Secondary | ICD-10-CM | POA: Diagnosis not present

## 2014-12-30 DIAGNOSIS — R1311 Dysphagia, oral phase: Secondary | ICD-10-CM | POA: Diagnosis not present

## 2014-12-30 DIAGNOSIS — M24572 Contracture, left ankle: Secondary | ICD-10-CM | POA: Diagnosis not present

## 2014-12-30 DIAGNOSIS — M24561 Contracture, right knee: Secondary | ICD-10-CM | POA: Diagnosis not present

## 2015-01-01 DIAGNOSIS — M6380 Disorders of muscle in diseases classified elsewhere, unspecified site: Secondary | ICD-10-CM | POA: Diagnosis not present

## 2015-01-01 DIAGNOSIS — M24572 Contracture, left ankle: Secondary | ICD-10-CM | POA: Diagnosis not present

## 2015-01-01 DIAGNOSIS — M24561 Contracture, right knee: Secondary | ICD-10-CM | POA: Diagnosis not present

## 2015-01-01 DIAGNOSIS — R278 Other lack of coordination: Secondary | ICD-10-CM | POA: Diagnosis not present

## 2015-01-01 DIAGNOSIS — G40909 Epilepsy, unspecified, not intractable, without status epilepticus: Secondary | ICD-10-CM | POA: Diagnosis not present

## 2015-01-01 DIAGNOSIS — M24571 Contracture, right ankle: Secondary | ICD-10-CM | POA: Diagnosis not present

## 2015-01-01 DIAGNOSIS — M6281 Muscle weakness (generalized): Secondary | ICD-10-CM | POA: Diagnosis not present

## 2015-01-01 DIAGNOSIS — R1311 Dysphagia, oral phase: Secondary | ICD-10-CM | POA: Diagnosis not present

## 2015-01-01 DIAGNOSIS — M24562 Contracture, left knee: Secondary | ICD-10-CM | POA: Diagnosis not present

## 2015-01-01 DIAGNOSIS — L600 Ingrowing nail: Secondary | ICD-10-CM | POA: Diagnosis not present

## 2015-01-04 DIAGNOSIS — N390 Urinary tract infection, site not specified: Secondary | ICD-10-CM | POA: Diagnosis not present

## 2015-01-05 DIAGNOSIS — R1311 Dysphagia, oral phase: Secondary | ICD-10-CM | POA: Diagnosis not present

## 2015-01-05 DIAGNOSIS — M24562 Contracture, left knee: Secondary | ICD-10-CM | POA: Diagnosis not present

## 2015-01-05 DIAGNOSIS — M24561 Contracture, right knee: Secondary | ICD-10-CM | POA: Diagnosis not present

## 2015-01-05 DIAGNOSIS — G40909 Epilepsy, unspecified, not intractable, without status epilepticus: Secondary | ICD-10-CM | POA: Diagnosis not present

## 2015-01-05 DIAGNOSIS — M6380 Disorders of muscle in diseases classified elsewhere, unspecified site: Secondary | ICD-10-CM | POA: Diagnosis not present

## 2015-01-05 DIAGNOSIS — M24572 Contracture, left ankle: Secondary | ICD-10-CM | POA: Diagnosis not present

## 2015-01-05 DIAGNOSIS — R278 Other lack of coordination: Secondary | ICD-10-CM | POA: Diagnosis not present

## 2015-01-05 DIAGNOSIS — M6281 Muscle weakness (generalized): Secondary | ICD-10-CM | POA: Diagnosis not present

## 2015-01-05 DIAGNOSIS — M24571 Contracture, right ankle: Secondary | ICD-10-CM | POA: Diagnosis not present

## 2015-01-06 DIAGNOSIS — M24561 Contracture, right knee: Secondary | ICD-10-CM | POA: Diagnosis not present

## 2015-01-06 DIAGNOSIS — M24571 Contracture, right ankle: Secondary | ICD-10-CM | POA: Diagnosis not present

## 2015-01-06 DIAGNOSIS — R278 Other lack of coordination: Secondary | ICD-10-CM | POA: Diagnosis not present

## 2015-01-06 DIAGNOSIS — M6281 Muscle weakness (generalized): Secondary | ICD-10-CM | POA: Diagnosis not present

## 2015-01-06 DIAGNOSIS — G40909 Epilepsy, unspecified, not intractable, without status epilepticus: Secondary | ICD-10-CM | POA: Diagnosis not present

## 2015-01-06 DIAGNOSIS — R1311 Dysphagia, oral phase: Secondary | ICD-10-CM | POA: Diagnosis not present

## 2015-01-06 DIAGNOSIS — M6380 Disorders of muscle in diseases classified elsewhere, unspecified site: Secondary | ICD-10-CM | POA: Diagnosis not present

## 2015-01-06 DIAGNOSIS — M24572 Contracture, left ankle: Secondary | ICD-10-CM | POA: Diagnosis not present

## 2015-01-06 DIAGNOSIS — M24562 Contracture, left knee: Secondary | ICD-10-CM | POA: Diagnosis not present

## 2015-01-07 DIAGNOSIS — R1311 Dysphagia, oral phase: Secondary | ICD-10-CM | POA: Diagnosis not present

## 2015-01-07 DIAGNOSIS — M6380 Disorders of muscle in diseases classified elsewhere, unspecified site: Secondary | ICD-10-CM | POA: Diagnosis not present

## 2015-01-07 DIAGNOSIS — M24571 Contracture, right ankle: Secondary | ICD-10-CM | POA: Diagnosis not present

## 2015-01-07 DIAGNOSIS — M24572 Contracture, left ankle: Secondary | ICD-10-CM | POA: Diagnosis not present

## 2015-01-07 DIAGNOSIS — M6281 Muscle weakness (generalized): Secondary | ICD-10-CM | POA: Diagnosis not present

## 2015-01-07 DIAGNOSIS — R278 Other lack of coordination: Secondary | ICD-10-CM | POA: Diagnosis not present

## 2015-01-07 DIAGNOSIS — M24562 Contracture, left knee: Secondary | ICD-10-CM | POA: Diagnosis not present

## 2015-01-07 DIAGNOSIS — M24561 Contracture, right knee: Secondary | ICD-10-CM | POA: Diagnosis not present

## 2015-01-07 DIAGNOSIS — G40909 Epilepsy, unspecified, not intractable, without status epilepticus: Secondary | ICD-10-CM | POA: Diagnosis not present

## 2015-01-08 DIAGNOSIS — M24572 Contracture, left ankle: Secondary | ICD-10-CM | POA: Diagnosis not present

## 2015-01-08 DIAGNOSIS — R1311 Dysphagia, oral phase: Secondary | ICD-10-CM | POA: Diagnosis not present

## 2015-01-08 DIAGNOSIS — M24571 Contracture, right ankle: Secondary | ICD-10-CM | POA: Diagnosis not present

## 2015-01-08 DIAGNOSIS — M24562 Contracture, left knee: Secondary | ICD-10-CM | POA: Diagnosis not present

## 2015-01-08 DIAGNOSIS — M6281 Muscle weakness (generalized): Secondary | ICD-10-CM | POA: Diagnosis not present

## 2015-01-08 DIAGNOSIS — M6380 Disorders of muscle in diseases classified elsewhere, unspecified site: Secondary | ICD-10-CM | POA: Diagnosis not present

## 2015-01-08 DIAGNOSIS — M24561 Contracture, right knee: Secondary | ICD-10-CM | POA: Diagnosis not present

## 2015-01-08 DIAGNOSIS — R278 Other lack of coordination: Secondary | ICD-10-CM | POA: Diagnosis not present

## 2015-01-08 DIAGNOSIS — G40909 Epilepsy, unspecified, not intractable, without status epilepticus: Secondary | ICD-10-CM | POA: Diagnosis not present

## 2015-01-11 DIAGNOSIS — M24561 Contracture, right knee: Secondary | ICD-10-CM | POA: Diagnosis not present

## 2015-01-11 DIAGNOSIS — G40909 Epilepsy, unspecified, not intractable, without status epilepticus: Secondary | ICD-10-CM | POA: Diagnosis not present

## 2015-01-11 DIAGNOSIS — R1311 Dysphagia, oral phase: Secondary | ICD-10-CM | POA: Diagnosis not present

## 2015-01-11 DIAGNOSIS — M6380 Disorders of muscle in diseases classified elsewhere, unspecified site: Secondary | ICD-10-CM | POA: Diagnosis not present

## 2015-01-11 DIAGNOSIS — M6281 Muscle weakness (generalized): Secondary | ICD-10-CM | POA: Diagnosis not present

## 2015-01-11 DIAGNOSIS — M24562 Contracture, left knee: Secondary | ICD-10-CM | POA: Diagnosis not present

## 2015-01-11 DIAGNOSIS — M24572 Contracture, left ankle: Secondary | ICD-10-CM | POA: Diagnosis not present

## 2015-01-11 DIAGNOSIS — R278 Other lack of coordination: Secondary | ICD-10-CM | POA: Diagnosis not present

## 2015-01-11 DIAGNOSIS — M24571 Contracture, right ankle: Secondary | ICD-10-CM | POA: Diagnosis not present

## 2015-01-12 DIAGNOSIS — M6281 Muscle weakness (generalized): Secondary | ICD-10-CM | POA: Diagnosis not present

## 2015-01-12 DIAGNOSIS — M24562 Contracture, left knee: Secondary | ICD-10-CM | POA: Diagnosis not present

## 2015-01-12 DIAGNOSIS — M24561 Contracture, right knee: Secondary | ICD-10-CM | POA: Diagnosis not present

## 2015-01-12 DIAGNOSIS — M24572 Contracture, left ankle: Secondary | ICD-10-CM | POA: Diagnosis not present

## 2015-01-12 DIAGNOSIS — M24571 Contracture, right ankle: Secondary | ICD-10-CM | POA: Diagnosis not present

## 2015-01-12 DIAGNOSIS — G40909 Epilepsy, unspecified, not intractable, without status epilepticus: Secondary | ICD-10-CM | POA: Diagnosis not present

## 2015-01-12 DIAGNOSIS — R1311 Dysphagia, oral phase: Secondary | ICD-10-CM | POA: Diagnosis not present

## 2015-01-12 DIAGNOSIS — M6380 Disorders of muscle in diseases classified elsewhere, unspecified site: Secondary | ICD-10-CM | POA: Diagnosis not present

## 2015-01-12 DIAGNOSIS — R278 Other lack of coordination: Secondary | ICD-10-CM | POA: Diagnosis not present

## 2015-01-13 DIAGNOSIS — R278 Other lack of coordination: Secondary | ICD-10-CM | POA: Diagnosis not present

## 2015-01-13 DIAGNOSIS — M24571 Contracture, right ankle: Secondary | ICD-10-CM | POA: Diagnosis not present

## 2015-01-13 DIAGNOSIS — R1311 Dysphagia, oral phase: Secondary | ICD-10-CM | POA: Diagnosis not present

## 2015-01-13 DIAGNOSIS — G40909 Epilepsy, unspecified, not intractable, without status epilepticus: Secondary | ICD-10-CM | POA: Diagnosis not present

## 2015-01-13 DIAGNOSIS — M24561 Contracture, right knee: Secondary | ICD-10-CM | POA: Diagnosis not present

## 2015-01-13 DIAGNOSIS — M24562 Contracture, left knee: Secondary | ICD-10-CM | POA: Diagnosis not present

## 2015-01-13 DIAGNOSIS — M6281 Muscle weakness (generalized): Secondary | ICD-10-CM | POA: Diagnosis not present

## 2015-01-13 DIAGNOSIS — M6380 Disorders of muscle in diseases classified elsewhere, unspecified site: Secondary | ICD-10-CM | POA: Diagnosis not present

## 2015-01-13 DIAGNOSIS — M24572 Contracture, left ankle: Secondary | ICD-10-CM | POA: Diagnosis not present

## 2015-01-14 DIAGNOSIS — M6281 Muscle weakness (generalized): Secondary | ICD-10-CM | POA: Diagnosis not present

## 2015-01-14 DIAGNOSIS — G40909 Epilepsy, unspecified, not intractable, without status epilepticus: Secondary | ICD-10-CM | POA: Diagnosis not present

## 2015-01-14 DIAGNOSIS — M24561 Contracture, right knee: Secondary | ICD-10-CM | POA: Diagnosis not present

## 2015-01-14 DIAGNOSIS — M6380 Disorders of muscle in diseases classified elsewhere, unspecified site: Secondary | ICD-10-CM | POA: Diagnosis not present

## 2015-01-14 DIAGNOSIS — M24562 Contracture, left knee: Secondary | ICD-10-CM | POA: Diagnosis not present

## 2015-01-14 DIAGNOSIS — R1311 Dysphagia, oral phase: Secondary | ICD-10-CM | POA: Diagnosis not present

## 2015-01-14 DIAGNOSIS — M24572 Contracture, left ankle: Secondary | ICD-10-CM | POA: Diagnosis not present

## 2015-01-14 DIAGNOSIS — R278 Other lack of coordination: Secondary | ICD-10-CM | POA: Diagnosis not present

## 2015-01-14 DIAGNOSIS — M24571 Contracture, right ankle: Secondary | ICD-10-CM | POA: Diagnosis not present

## 2015-01-15 DIAGNOSIS — M24562 Contracture, left knee: Secondary | ICD-10-CM | POA: Diagnosis not present

## 2015-01-15 DIAGNOSIS — R278 Other lack of coordination: Secondary | ICD-10-CM | POA: Diagnosis not present

## 2015-01-15 DIAGNOSIS — M24561 Contracture, right knee: Secondary | ICD-10-CM | POA: Diagnosis not present

## 2015-01-15 DIAGNOSIS — M6281 Muscle weakness (generalized): Secondary | ICD-10-CM | POA: Diagnosis not present

## 2015-01-15 DIAGNOSIS — R1311 Dysphagia, oral phase: Secondary | ICD-10-CM | POA: Diagnosis not present

## 2015-01-15 DIAGNOSIS — G40909 Epilepsy, unspecified, not intractable, without status epilepticus: Secondary | ICD-10-CM | POA: Diagnosis not present

## 2015-01-15 DIAGNOSIS — M6380 Disorders of muscle in diseases classified elsewhere, unspecified site: Secondary | ICD-10-CM | POA: Diagnosis not present

## 2015-01-15 DIAGNOSIS — M24572 Contracture, left ankle: Secondary | ICD-10-CM | POA: Diagnosis not present

## 2015-01-15 DIAGNOSIS — M24571 Contracture, right ankle: Secondary | ICD-10-CM | POA: Diagnosis not present

## 2015-01-18 DIAGNOSIS — M24562 Contracture, left knee: Secondary | ICD-10-CM | POA: Diagnosis not present

## 2015-01-18 DIAGNOSIS — M6380 Disorders of muscle in diseases classified elsewhere, unspecified site: Secondary | ICD-10-CM | POA: Diagnosis not present

## 2015-01-18 DIAGNOSIS — M24572 Contracture, left ankle: Secondary | ICD-10-CM | POA: Diagnosis not present

## 2015-01-18 DIAGNOSIS — G40909 Epilepsy, unspecified, not intractable, without status epilepticus: Secondary | ICD-10-CM | POA: Diagnosis not present

## 2015-01-18 DIAGNOSIS — R1311 Dysphagia, oral phase: Secondary | ICD-10-CM | POA: Diagnosis not present

## 2015-01-18 DIAGNOSIS — M24571 Contracture, right ankle: Secondary | ICD-10-CM | POA: Diagnosis not present

## 2015-01-18 DIAGNOSIS — M24561 Contracture, right knee: Secondary | ICD-10-CM | POA: Diagnosis not present

## 2015-01-18 DIAGNOSIS — R278 Other lack of coordination: Secondary | ICD-10-CM | POA: Diagnosis not present

## 2015-01-18 DIAGNOSIS — M6281 Muscle weakness (generalized): Secondary | ICD-10-CM | POA: Diagnosis not present

## 2015-01-19 DIAGNOSIS — M24572 Contracture, left ankle: Secondary | ICD-10-CM | POA: Diagnosis not present

## 2015-01-19 DIAGNOSIS — M24562 Contracture, left knee: Secondary | ICD-10-CM | POA: Diagnosis not present

## 2015-01-19 DIAGNOSIS — G40909 Epilepsy, unspecified, not intractable, without status epilepticus: Secondary | ICD-10-CM | POA: Diagnosis not present

## 2015-01-19 DIAGNOSIS — M24571 Contracture, right ankle: Secondary | ICD-10-CM | POA: Diagnosis not present

## 2015-01-19 DIAGNOSIS — M6281 Muscle weakness (generalized): Secondary | ICD-10-CM | POA: Diagnosis not present

## 2015-01-19 DIAGNOSIS — R1311 Dysphagia, oral phase: Secondary | ICD-10-CM | POA: Diagnosis not present

## 2015-01-19 DIAGNOSIS — M6380 Disorders of muscle in diseases classified elsewhere, unspecified site: Secondary | ICD-10-CM | POA: Diagnosis not present

## 2015-01-19 DIAGNOSIS — M24561 Contracture, right knee: Secondary | ICD-10-CM | POA: Diagnosis not present

## 2015-01-19 DIAGNOSIS — R278 Other lack of coordination: Secondary | ICD-10-CM | POA: Diagnosis not present

## 2015-01-20 DIAGNOSIS — M6380 Disorders of muscle in diseases classified elsewhere, unspecified site: Secondary | ICD-10-CM | POA: Diagnosis not present

## 2015-01-20 DIAGNOSIS — G40909 Epilepsy, unspecified, not intractable, without status epilepticus: Secondary | ICD-10-CM | POA: Diagnosis not present

## 2015-01-20 DIAGNOSIS — M24562 Contracture, left knee: Secondary | ICD-10-CM | POA: Diagnosis not present

## 2015-01-20 DIAGNOSIS — M6281 Muscle weakness (generalized): Secondary | ICD-10-CM | POA: Diagnosis not present

## 2015-01-20 DIAGNOSIS — M24561 Contracture, right knee: Secondary | ICD-10-CM | POA: Diagnosis not present

## 2015-01-20 DIAGNOSIS — R1311 Dysphagia, oral phase: Secondary | ICD-10-CM | POA: Diagnosis not present

## 2015-01-20 DIAGNOSIS — R278 Other lack of coordination: Secondary | ICD-10-CM | POA: Diagnosis not present

## 2015-01-20 DIAGNOSIS — M24571 Contracture, right ankle: Secondary | ICD-10-CM | POA: Diagnosis not present

## 2015-01-20 DIAGNOSIS — M24572 Contracture, left ankle: Secondary | ICD-10-CM | POA: Diagnosis not present

## 2015-01-21 DIAGNOSIS — R278 Other lack of coordination: Secondary | ICD-10-CM | POA: Diagnosis not present

## 2015-01-21 DIAGNOSIS — M24562 Contracture, left knee: Secondary | ICD-10-CM | POA: Diagnosis not present

## 2015-01-21 DIAGNOSIS — R1311 Dysphagia, oral phase: Secondary | ICD-10-CM | POA: Diagnosis not present

## 2015-01-21 DIAGNOSIS — M6380 Disorders of muscle in diseases classified elsewhere, unspecified site: Secondary | ICD-10-CM | POA: Diagnosis not present

## 2015-01-21 DIAGNOSIS — L89154 Pressure ulcer of sacral region, stage 4: Secondary | ICD-10-CM | POA: Diagnosis not present

## 2015-01-21 DIAGNOSIS — M24571 Contracture, right ankle: Secondary | ICD-10-CM | POA: Diagnosis not present

## 2015-01-21 DIAGNOSIS — M24561 Contracture, right knee: Secondary | ICD-10-CM | POA: Diagnosis not present

## 2015-01-21 DIAGNOSIS — G40909 Epilepsy, unspecified, not intractable, without status epilepticus: Secondary | ICD-10-CM | POA: Diagnosis not present

## 2015-01-21 DIAGNOSIS — L98499 Non-pressure chronic ulcer of skin of other sites with unspecified severity: Secondary | ICD-10-CM | POA: Diagnosis not present

## 2015-01-21 DIAGNOSIS — M6281 Muscle weakness (generalized): Secondary | ICD-10-CM | POA: Diagnosis not present

## 2015-01-21 DIAGNOSIS — M24572 Contracture, left ankle: Secondary | ICD-10-CM | POA: Diagnosis not present

## 2015-01-22 DIAGNOSIS — M6380 Disorders of muscle in diseases classified elsewhere, unspecified site: Secondary | ICD-10-CM | POA: Diagnosis not present

## 2015-01-22 DIAGNOSIS — M24571 Contracture, right ankle: Secondary | ICD-10-CM | POA: Diagnosis not present

## 2015-01-22 DIAGNOSIS — R278 Other lack of coordination: Secondary | ICD-10-CM | POA: Diagnosis not present

## 2015-01-22 DIAGNOSIS — M24572 Contracture, left ankle: Secondary | ICD-10-CM | POA: Diagnosis not present

## 2015-01-22 DIAGNOSIS — G40909 Epilepsy, unspecified, not intractable, without status epilepticus: Secondary | ICD-10-CM | POA: Diagnosis not present

## 2015-01-22 DIAGNOSIS — M6281 Muscle weakness (generalized): Secondary | ICD-10-CM | POA: Diagnosis not present

## 2015-01-25 DIAGNOSIS — G40909 Epilepsy, unspecified, not intractable, without status epilepticus: Secondary | ICD-10-CM | POA: Diagnosis not present

## 2015-01-25 DIAGNOSIS — M6380 Disorders of muscle in diseases classified elsewhere, unspecified site: Secondary | ICD-10-CM | POA: Diagnosis not present

## 2015-01-25 DIAGNOSIS — M24572 Contracture, left ankle: Secondary | ICD-10-CM | POA: Diagnosis not present

## 2015-01-25 DIAGNOSIS — M6281 Muscle weakness (generalized): Secondary | ICD-10-CM | POA: Diagnosis not present

## 2015-01-25 DIAGNOSIS — R278 Other lack of coordination: Secondary | ICD-10-CM | POA: Diagnosis not present

## 2015-01-25 DIAGNOSIS — M24571 Contracture, right ankle: Secondary | ICD-10-CM | POA: Diagnosis not present

## 2015-01-26 DIAGNOSIS — M24572 Contracture, left ankle: Secondary | ICD-10-CM | POA: Diagnosis not present

## 2015-01-26 DIAGNOSIS — R278 Other lack of coordination: Secondary | ICD-10-CM | POA: Diagnosis not present

## 2015-01-26 DIAGNOSIS — M24571 Contracture, right ankle: Secondary | ICD-10-CM | POA: Diagnosis not present

## 2015-01-26 DIAGNOSIS — G40909 Epilepsy, unspecified, not intractable, without status epilepticus: Secondary | ICD-10-CM | POA: Diagnosis not present

## 2015-01-26 DIAGNOSIS — M6380 Disorders of muscle in diseases classified elsewhere, unspecified site: Secondary | ICD-10-CM | POA: Diagnosis not present

## 2015-01-26 DIAGNOSIS — M6281 Muscle weakness (generalized): Secondary | ICD-10-CM | POA: Diagnosis not present

## 2015-01-27 DIAGNOSIS — R278 Other lack of coordination: Secondary | ICD-10-CM | POA: Diagnosis not present

## 2015-01-27 DIAGNOSIS — M6380 Disorders of muscle in diseases classified elsewhere, unspecified site: Secondary | ICD-10-CM | POA: Diagnosis not present

## 2015-01-27 DIAGNOSIS — G40909 Epilepsy, unspecified, not intractable, without status epilepticus: Secondary | ICD-10-CM | POA: Diagnosis not present

## 2015-01-27 DIAGNOSIS — M6281 Muscle weakness (generalized): Secondary | ICD-10-CM | POA: Diagnosis not present

## 2015-01-27 DIAGNOSIS — M24571 Contracture, right ankle: Secondary | ICD-10-CM | POA: Diagnosis not present

## 2015-01-27 DIAGNOSIS — M24572 Contracture, left ankle: Secondary | ICD-10-CM | POA: Diagnosis not present

## 2015-01-28 DIAGNOSIS — M24572 Contracture, left ankle: Secondary | ICD-10-CM | POA: Diagnosis not present

## 2015-01-28 DIAGNOSIS — M24571 Contracture, right ankle: Secondary | ICD-10-CM | POA: Diagnosis not present

## 2015-01-28 DIAGNOSIS — M6281 Muscle weakness (generalized): Secondary | ICD-10-CM | POA: Diagnosis not present

## 2015-01-28 DIAGNOSIS — G40909 Epilepsy, unspecified, not intractable, without status epilepticus: Secondary | ICD-10-CM | POA: Diagnosis not present

## 2015-01-28 DIAGNOSIS — M6380 Disorders of muscle in diseases classified elsewhere, unspecified site: Secondary | ICD-10-CM | POA: Diagnosis not present

## 2015-01-28 DIAGNOSIS — R278 Other lack of coordination: Secondary | ICD-10-CM | POA: Diagnosis not present

## 2015-01-29 DIAGNOSIS — M6380 Disorders of muscle in diseases classified elsewhere, unspecified site: Secondary | ICD-10-CM | POA: Diagnosis not present

## 2015-01-29 DIAGNOSIS — M24571 Contracture, right ankle: Secondary | ICD-10-CM | POA: Diagnosis not present

## 2015-01-29 DIAGNOSIS — G40909 Epilepsy, unspecified, not intractable, without status epilepticus: Secondary | ICD-10-CM | POA: Diagnosis not present

## 2015-01-29 DIAGNOSIS — R278 Other lack of coordination: Secondary | ICD-10-CM | POA: Diagnosis not present

## 2015-01-29 DIAGNOSIS — M6281 Muscle weakness (generalized): Secondary | ICD-10-CM | POA: Diagnosis not present

## 2015-01-29 DIAGNOSIS — M24572 Contracture, left ankle: Secondary | ICD-10-CM | POA: Diagnosis not present

## 2015-01-31 DIAGNOSIS — M6380 Disorders of muscle in diseases classified elsewhere, unspecified site: Secondary | ICD-10-CM | POA: Diagnosis not present

## 2015-01-31 DIAGNOSIS — R278 Other lack of coordination: Secondary | ICD-10-CM | POA: Diagnosis not present

## 2015-01-31 DIAGNOSIS — M6281 Muscle weakness (generalized): Secondary | ICD-10-CM | POA: Diagnosis not present

## 2015-01-31 DIAGNOSIS — G40909 Epilepsy, unspecified, not intractable, without status epilepticus: Secondary | ICD-10-CM | POA: Diagnosis not present

## 2015-01-31 DIAGNOSIS — M24571 Contracture, right ankle: Secondary | ICD-10-CM | POA: Diagnosis not present

## 2015-01-31 DIAGNOSIS — M24572 Contracture, left ankle: Secondary | ICD-10-CM | POA: Diagnosis not present

## 2015-02-01 DIAGNOSIS — R278 Other lack of coordination: Secondary | ICD-10-CM | POA: Diagnosis not present

## 2015-02-01 DIAGNOSIS — M6281 Muscle weakness (generalized): Secondary | ICD-10-CM | POA: Diagnosis not present

## 2015-02-01 DIAGNOSIS — G40909 Epilepsy, unspecified, not intractable, without status epilepticus: Secondary | ICD-10-CM | POA: Diagnosis not present

## 2015-02-01 DIAGNOSIS — M24572 Contracture, left ankle: Secondary | ICD-10-CM | POA: Diagnosis not present

## 2015-02-01 DIAGNOSIS — M24571 Contracture, right ankle: Secondary | ICD-10-CM | POA: Diagnosis not present

## 2015-02-01 DIAGNOSIS — M6380 Disorders of muscle in diseases classified elsewhere, unspecified site: Secondary | ICD-10-CM | POA: Diagnosis not present

## 2015-02-02 DIAGNOSIS — M24572 Contracture, left ankle: Secondary | ICD-10-CM | POA: Diagnosis not present

## 2015-02-02 DIAGNOSIS — M6281 Muscle weakness (generalized): Secondary | ICD-10-CM | POA: Diagnosis not present

## 2015-02-02 DIAGNOSIS — M6380 Disorders of muscle in diseases classified elsewhere, unspecified site: Secondary | ICD-10-CM | POA: Diagnosis not present

## 2015-02-02 DIAGNOSIS — R278 Other lack of coordination: Secondary | ICD-10-CM | POA: Diagnosis not present

## 2015-02-02 DIAGNOSIS — M24571 Contracture, right ankle: Secondary | ICD-10-CM | POA: Diagnosis not present

## 2015-02-02 DIAGNOSIS — G40909 Epilepsy, unspecified, not intractable, without status epilepticus: Secondary | ICD-10-CM | POA: Diagnosis not present

## 2015-02-03 DIAGNOSIS — M6281 Muscle weakness (generalized): Secondary | ICD-10-CM | POA: Diagnosis not present

## 2015-02-03 DIAGNOSIS — M24572 Contracture, left ankle: Secondary | ICD-10-CM | POA: Diagnosis not present

## 2015-02-03 DIAGNOSIS — M24571 Contracture, right ankle: Secondary | ICD-10-CM | POA: Diagnosis not present

## 2015-02-03 DIAGNOSIS — G40909 Epilepsy, unspecified, not intractable, without status epilepticus: Secondary | ICD-10-CM | POA: Diagnosis not present

## 2015-02-03 DIAGNOSIS — R278 Other lack of coordination: Secondary | ICD-10-CM | POA: Diagnosis not present

## 2015-02-03 DIAGNOSIS — M6380 Disorders of muscle in diseases classified elsewhere, unspecified site: Secondary | ICD-10-CM | POA: Diagnosis not present

## 2015-02-04 DIAGNOSIS — G40909 Epilepsy, unspecified, not intractable, without status epilepticus: Secondary | ICD-10-CM | POA: Diagnosis not present

## 2015-02-04 DIAGNOSIS — L89154 Pressure ulcer of sacral region, stage 4: Secondary | ICD-10-CM | POA: Diagnosis not present

## 2015-02-04 DIAGNOSIS — H2513 Age-related nuclear cataract, bilateral: Secondary | ICD-10-CM | POA: Diagnosis not present

## 2015-02-04 DIAGNOSIS — H3589 Other specified retinal disorders: Secondary | ICD-10-CM | POA: Diagnosis not present

## 2015-02-04 DIAGNOSIS — H47012 Ischemic optic neuropathy, left eye: Secondary | ICD-10-CM | POA: Diagnosis not present

## 2015-02-04 DIAGNOSIS — M6380 Disorders of muscle in diseases classified elsewhere, unspecified site: Secondary | ICD-10-CM | POA: Diagnosis not present

## 2015-02-04 DIAGNOSIS — M6281 Muscle weakness (generalized): Secondary | ICD-10-CM | POA: Diagnosis not present

## 2015-02-04 DIAGNOSIS — H47011 Ischemic optic neuropathy, right eye: Secondary | ICD-10-CM | POA: Diagnosis not present

## 2015-02-04 DIAGNOSIS — R278 Other lack of coordination: Secondary | ICD-10-CM | POA: Diagnosis not present

## 2015-02-04 DIAGNOSIS — M24572 Contracture, left ankle: Secondary | ICD-10-CM | POA: Diagnosis not present

## 2015-02-04 DIAGNOSIS — M24571 Contracture, right ankle: Secondary | ICD-10-CM | POA: Diagnosis not present

## 2015-02-05 DIAGNOSIS — M6281 Muscle weakness (generalized): Secondary | ICD-10-CM | POA: Diagnosis not present

## 2015-02-05 DIAGNOSIS — M24571 Contracture, right ankle: Secondary | ICD-10-CM | POA: Diagnosis not present

## 2015-02-05 DIAGNOSIS — M24572 Contracture, left ankle: Secondary | ICD-10-CM | POA: Diagnosis not present

## 2015-02-05 DIAGNOSIS — G40909 Epilepsy, unspecified, not intractable, without status epilepticus: Secondary | ICD-10-CM | POA: Diagnosis not present

## 2015-02-05 DIAGNOSIS — R278 Other lack of coordination: Secondary | ICD-10-CM | POA: Diagnosis not present

## 2015-02-05 DIAGNOSIS — M6380 Disorders of muscle in diseases classified elsewhere, unspecified site: Secondary | ICD-10-CM | POA: Diagnosis not present

## 2015-02-08 DIAGNOSIS — M6380 Disorders of muscle in diseases classified elsewhere, unspecified site: Secondary | ICD-10-CM | POA: Diagnosis not present

## 2015-02-08 DIAGNOSIS — R278 Other lack of coordination: Secondary | ICD-10-CM | POA: Diagnosis not present

## 2015-02-08 DIAGNOSIS — G40909 Epilepsy, unspecified, not intractable, without status epilepticus: Secondary | ICD-10-CM | POA: Diagnosis not present

## 2015-02-08 DIAGNOSIS — M24571 Contracture, right ankle: Secondary | ICD-10-CM | POA: Diagnosis not present

## 2015-02-08 DIAGNOSIS — M24572 Contracture, left ankle: Secondary | ICD-10-CM | POA: Diagnosis not present

## 2015-02-08 DIAGNOSIS — M6281 Muscle weakness (generalized): Secondary | ICD-10-CM | POA: Diagnosis not present

## 2015-02-09 DIAGNOSIS — M24571 Contracture, right ankle: Secondary | ICD-10-CM | POA: Diagnosis not present

## 2015-02-09 DIAGNOSIS — G40909 Epilepsy, unspecified, not intractable, without status epilepticus: Secondary | ICD-10-CM | POA: Diagnosis not present

## 2015-02-09 DIAGNOSIS — M6380 Disorders of muscle in diseases classified elsewhere, unspecified site: Secondary | ICD-10-CM | POA: Diagnosis not present

## 2015-02-09 DIAGNOSIS — R278 Other lack of coordination: Secondary | ICD-10-CM | POA: Diagnosis not present

## 2015-02-09 DIAGNOSIS — M6281 Muscle weakness (generalized): Secondary | ICD-10-CM | POA: Diagnosis not present

## 2015-02-09 DIAGNOSIS — M24572 Contracture, left ankle: Secondary | ICD-10-CM | POA: Diagnosis not present

## 2015-02-10 DIAGNOSIS — M6281 Muscle weakness (generalized): Secondary | ICD-10-CM | POA: Diagnosis not present

## 2015-02-10 DIAGNOSIS — M6380 Disorders of muscle in diseases classified elsewhere, unspecified site: Secondary | ICD-10-CM | POA: Diagnosis not present

## 2015-02-10 DIAGNOSIS — M24571 Contracture, right ankle: Secondary | ICD-10-CM | POA: Diagnosis not present

## 2015-02-10 DIAGNOSIS — M24572 Contracture, left ankle: Secondary | ICD-10-CM | POA: Diagnosis not present

## 2015-02-10 DIAGNOSIS — R278 Other lack of coordination: Secondary | ICD-10-CM | POA: Diagnosis not present

## 2015-02-10 DIAGNOSIS — G40909 Epilepsy, unspecified, not intractable, without status epilepticus: Secondary | ICD-10-CM | POA: Diagnosis not present

## 2015-02-11 DIAGNOSIS — M6380 Disorders of muscle in diseases classified elsewhere, unspecified site: Secondary | ICD-10-CM | POA: Diagnosis not present

## 2015-02-11 DIAGNOSIS — M6281 Muscle weakness (generalized): Secondary | ICD-10-CM | POA: Diagnosis not present

## 2015-02-11 DIAGNOSIS — G40909 Epilepsy, unspecified, not intractable, without status epilepticus: Secondary | ICD-10-CM | POA: Diagnosis not present

## 2015-02-11 DIAGNOSIS — M24571 Contracture, right ankle: Secondary | ICD-10-CM | POA: Diagnosis not present

## 2015-02-11 DIAGNOSIS — M24572 Contracture, left ankle: Secondary | ICD-10-CM | POA: Diagnosis not present

## 2015-02-11 DIAGNOSIS — R278 Other lack of coordination: Secondary | ICD-10-CM | POA: Diagnosis not present

## 2015-02-12 DIAGNOSIS — M24572 Contracture, left ankle: Secondary | ICD-10-CM | POA: Diagnosis not present

## 2015-02-12 DIAGNOSIS — R278 Other lack of coordination: Secondary | ICD-10-CM | POA: Diagnosis not present

## 2015-02-12 DIAGNOSIS — M6380 Disorders of muscle in diseases classified elsewhere, unspecified site: Secondary | ICD-10-CM | POA: Diagnosis not present

## 2015-02-12 DIAGNOSIS — M6281 Muscle weakness (generalized): Secondary | ICD-10-CM | POA: Diagnosis not present

## 2015-02-12 DIAGNOSIS — M24571 Contracture, right ankle: Secondary | ICD-10-CM | POA: Diagnosis not present

## 2015-02-12 DIAGNOSIS — G40909 Epilepsy, unspecified, not intractable, without status epilepticus: Secondary | ICD-10-CM | POA: Diagnosis not present

## 2015-02-15 DIAGNOSIS — L89154 Pressure ulcer of sacral region, stage 4: Secondary | ICD-10-CM | POA: Diagnosis not present

## 2015-02-15 DIAGNOSIS — G40909 Epilepsy, unspecified, not intractable, without status epilepticus: Secondary | ICD-10-CM | POA: Diagnosis not present

## 2015-02-15 DIAGNOSIS — M6380 Disorders of muscle in diseases classified elsewhere, unspecified site: Secondary | ICD-10-CM | POA: Diagnosis not present

## 2015-02-15 DIAGNOSIS — R278 Other lack of coordination: Secondary | ICD-10-CM | POA: Diagnosis not present

## 2015-02-15 DIAGNOSIS — M24572 Contracture, left ankle: Secondary | ICD-10-CM | POA: Diagnosis not present

## 2015-02-15 DIAGNOSIS — M24571 Contracture, right ankle: Secondary | ICD-10-CM | POA: Diagnosis not present

## 2015-02-15 DIAGNOSIS — M6281 Muscle weakness (generalized): Secondary | ICD-10-CM | POA: Diagnosis not present

## 2015-02-16 DIAGNOSIS — M24572 Contracture, left ankle: Secondary | ICD-10-CM | POA: Diagnosis not present

## 2015-02-16 DIAGNOSIS — M6281 Muscle weakness (generalized): Secondary | ICD-10-CM | POA: Diagnosis not present

## 2015-02-16 DIAGNOSIS — M6380 Disorders of muscle in diseases classified elsewhere, unspecified site: Secondary | ICD-10-CM | POA: Diagnosis not present

## 2015-02-16 DIAGNOSIS — G40909 Epilepsy, unspecified, not intractable, without status epilepticus: Secondary | ICD-10-CM | POA: Diagnosis not present

## 2015-02-16 DIAGNOSIS — M24571 Contracture, right ankle: Secondary | ICD-10-CM | POA: Diagnosis not present

## 2015-02-16 DIAGNOSIS — R278 Other lack of coordination: Secondary | ICD-10-CM | POA: Diagnosis not present

## 2015-02-17 DIAGNOSIS — R278 Other lack of coordination: Secondary | ICD-10-CM | POA: Diagnosis not present

## 2015-02-17 DIAGNOSIS — M6281 Muscle weakness (generalized): Secondary | ICD-10-CM | POA: Diagnosis not present

## 2015-02-17 DIAGNOSIS — M24572 Contracture, left ankle: Secondary | ICD-10-CM | POA: Diagnosis not present

## 2015-02-17 DIAGNOSIS — M24571 Contracture, right ankle: Secondary | ICD-10-CM | POA: Diagnosis not present

## 2015-02-17 DIAGNOSIS — M6380 Disorders of muscle in diseases classified elsewhere, unspecified site: Secondary | ICD-10-CM | POA: Diagnosis not present

## 2015-02-17 DIAGNOSIS — G40909 Epilepsy, unspecified, not intractable, without status epilepticus: Secondary | ICD-10-CM | POA: Diagnosis not present

## 2015-02-18 DIAGNOSIS — M24571 Contracture, right ankle: Secondary | ICD-10-CM | POA: Diagnosis not present

## 2015-02-18 DIAGNOSIS — M6380 Disorders of muscle in diseases classified elsewhere, unspecified site: Secondary | ICD-10-CM | POA: Diagnosis not present

## 2015-02-18 DIAGNOSIS — R278 Other lack of coordination: Secondary | ICD-10-CM | POA: Diagnosis not present

## 2015-02-18 DIAGNOSIS — G40909 Epilepsy, unspecified, not intractable, without status epilepticus: Secondary | ICD-10-CM | POA: Diagnosis not present

## 2015-02-18 DIAGNOSIS — M6281 Muscle weakness (generalized): Secondary | ICD-10-CM | POA: Diagnosis not present

## 2015-02-18 DIAGNOSIS — M24572 Contracture, left ankle: Secondary | ICD-10-CM | POA: Diagnosis not present

## 2015-02-19 DIAGNOSIS — M6281 Muscle weakness (generalized): Secondary | ICD-10-CM | POA: Diagnosis not present

## 2015-02-19 DIAGNOSIS — G40909 Epilepsy, unspecified, not intractable, without status epilepticus: Secondary | ICD-10-CM | POA: Diagnosis not present

## 2015-02-19 DIAGNOSIS — M24571 Contracture, right ankle: Secondary | ICD-10-CM | POA: Diagnosis not present

## 2015-02-19 DIAGNOSIS — M6380 Disorders of muscle in diseases classified elsewhere, unspecified site: Secondary | ICD-10-CM | POA: Diagnosis not present

## 2015-02-19 DIAGNOSIS — M24572 Contracture, left ankle: Secondary | ICD-10-CM | POA: Diagnosis not present

## 2015-02-19 DIAGNOSIS — R278 Other lack of coordination: Secondary | ICD-10-CM | POA: Diagnosis not present

## 2015-02-22 DIAGNOSIS — R278 Other lack of coordination: Secondary | ICD-10-CM | POA: Diagnosis not present

## 2015-02-22 DIAGNOSIS — G40909 Epilepsy, unspecified, not intractable, without status epilepticus: Secondary | ICD-10-CM | POA: Diagnosis not present

## 2015-02-22 DIAGNOSIS — M24572 Contracture, left ankle: Secondary | ICD-10-CM | POA: Diagnosis not present

## 2015-02-22 DIAGNOSIS — M6281 Muscle weakness (generalized): Secondary | ICD-10-CM | POA: Diagnosis not present

## 2015-02-22 DIAGNOSIS — M24571 Contracture, right ankle: Secondary | ICD-10-CM | POA: Diagnosis not present

## 2015-02-22 DIAGNOSIS — M6380 Disorders of muscle in diseases classified elsewhere, unspecified site: Secondary | ICD-10-CM | POA: Diagnosis not present

## 2015-02-23 DIAGNOSIS — M24572 Contracture, left ankle: Secondary | ICD-10-CM | POA: Diagnosis not present

## 2015-02-23 DIAGNOSIS — R278 Other lack of coordination: Secondary | ICD-10-CM | POA: Diagnosis not present

## 2015-02-23 DIAGNOSIS — M6281 Muscle weakness (generalized): Secondary | ICD-10-CM | POA: Diagnosis not present

## 2015-02-23 DIAGNOSIS — M24571 Contracture, right ankle: Secondary | ICD-10-CM | POA: Diagnosis not present

## 2015-02-23 DIAGNOSIS — G40909 Epilepsy, unspecified, not intractable, without status epilepticus: Secondary | ICD-10-CM | POA: Diagnosis not present

## 2015-02-23 DIAGNOSIS — M6380 Disorders of muscle in diseases classified elsewhere, unspecified site: Secondary | ICD-10-CM | POA: Diagnosis not present

## 2015-02-24 DIAGNOSIS — M6281 Muscle weakness (generalized): Secondary | ICD-10-CM | POA: Diagnosis not present

## 2015-02-24 DIAGNOSIS — R278 Other lack of coordination: Secondary | ICD-10-CM | POA: Diagnosis not present

## 2015-02-24 DIAGNOSIS — M79671 Pain in right foot: Secondary | ICD-10-CM | POA: Diagnosis not present

## 2015-02-24 DIAGNOSIS — M24571 Contracture, right ankle: Secondary | ICD-10-CM | POA: Diagnosis not present

## 2015-02-24 DIAGNOSIS — M79672 Pain in left foot: Secondary | ICD-10-CM | POA: Diagnosis not present

## 2015-02-24 DIAGNOSIS — R52 Pain, unspecified: Secondary | ICD-10-CM | POA: Diagnosis not present

## 2015-02-24 DIAGNOSIS — M6380 Disorders of muscle in diseases classified elsewhere, unspecified site: Secondary | ICD-10-CM | POA: Diagnosis not present

## 2015-02-24 DIAGNOSIS — E119 Type 2 diabetes mellitus without complications: Secondary | ICD-10-CM | POA: Diagnosis not present

## 2015-02-24 DIAGNOSIS — M24572 Contracture, left ankle: Secondary | ICD-10-CM | POA: Diagnosis not present

## 2015-02-24 DIAGNOSIS — R300 Dysuria: Secondary | ICD-10-CM | POA: Diagnosis not present

## 2015-02-24 DIAGNOSIS — B351 Tinea unguium: Secondary | ICD-10-CM | POA: Diagnosis not present

## 2015-02-24 DIAGNOSIS — Q845 Enlarged and hypertrophic nails: Secondary | ICD-10-CM | POA: Diagnosis not present

## 2015-02-24 DIAGNOSIS — G40909 Epilepsy, unspecified, not intractable, without status epilepticus: Secondary | ICD-10-CM | POA: Diagnosis not present

## 2015-02-24 DIAGNOSIS — L603 Nail dystrophy: Secondary | ICD-10-CM | POA: Diagnosis not present

## 2015-02-25 DIAGNOSIS — M24572 Contracture, left ankle: Secondary | ICD-10-CM | POA: Diagnosis not present

## 2015-02-25 DIAGNOSIS — M6281 Muscle weakness (generalized): Secondary | ICD-10-CM | POA: Diagnosis not present

## 2015-02-25 DIAGNOSIS — R278 Other lack of coordination: Secondary | ICD-10-CM | POA: Diagnosis not present

## 2015-02-25 DIAGNOSIS — M6380 Disorders of muscle in diseases classified elsewhere, unspecified site: Secondary | ICD-10-CM | POA: Diagnosis not present

## 2015-02-25 DIAGNOSIS — M24571 Contracture, right ankle: Secondary | ICD-10-CM | POA: Diagnosis not present

## 2015-02-25 DIAGNOSIS — G40909 Epilepsy, unspecified, not intractable, without status epilepticus: Secondary | ICD-10-CM | POA: Diagnosis not present

## 2015-02-25 DIAGNOSIS — L89154 Pressure ulcer of sacral region, stage 4: Secondary | ICD-10-CM | POA: Diagnosis not present

## 2015-02-26 DIAGNOSIS — M6380 Disorders of muscle in diseases classified elsewhere, unspecified site: Secondary | ICD-10-CM | POA: Diagnosis not present

## 2015-02-26 DIAGNOSIS — M6281 Muscle weakness (generalized): Secondary | ICD-10-CM | POA: Diagnosis not present

## 2015-02-26 DIAGNOSIS — R278 Other lack of coordination: Secondary | ICD-10-CM | POA: Diagnosis not present

## 2015-02-26 DIAGNOSIS — G40909 Epilepsy, unspecified, not intractable, without status epilepticus: Secondary | ICD-10-CM | POA: Diagnosis not present

## 2015-02-26 DIAGNOSIS — M24571 Contracture, right ankle: Secondary | ICD-10-CM | POA: Diagnosis not present

## 2015-02-26 DIAGNOSIS — M24572 Contracture, left ankle: Secondary | ICD-10-CM | POA: Diagnosis not present

## 2015-02-26 DIAGNOSIS — R531 Weakness: Secondary | ICD-10-CM | POA: Diagnosis not present

## 2015-03-01 DIAGNOSIS — R278 Other lack of coordination: Secondary | ICD-10-CM | POA: Diagnosis not present

## 2015-03-01 DIAGNOSIS — M6380 Disorders of muscle in diseases classified elsewhere, unspecified site: Secondary | ICD-10-CM | POA: Diagnosis not present

## 2015-03-01 DIAGNOSIS — M6281 Muscle weakness (generalized): Secondary | ICD-10-CM | POA: Diagnosis not present

## 2015-03-01 DIAGNOSIS — M24572 Contracture, left ankle: Secondary | ICD-10-CM | POA: Diagnosis not present

## 2015-03-01 DIAGNOSIS — M24571 Contracture, right ankle: Secondary | ICD-10-CM | POA: Diagnosis not present

## 2015-03-01 DIAGNOSIS — G40909 Epilepsy, unspecified, not intractable, without status epilepticus: Secondary | ICD-10-CM | POA: Diagnosis not present

## 2015-03-02 DIAGNOSIS — M6380 Disorders of muscle in diseases classified elsewhere, unspecified site: Secondary | ICD-10-CM | POA: Diagnosis not present

## 2015-03-02 DIAGNOSIS — M6281 Muscle weakness (generalized): Secondary | ICD-10-CM | POA: Diagnosis not present

## 2015-03-02 DIAGNOSIS — M24571 Contracture, right ankle: Secondary | ICD-10-CM | POA: Diagnosis not present

## 2015-03-02 DIAGNOSIS — R278 Other lack of coordination: Secondary | ICD-10-CM | POA: Diagnosis not present

## 2015-03-02 DIAGNOSIS — M24572 Contracture, left ankle: Secondary | ICD-10-CM | POA: Diagnosis not present

## 2015-03-02 DIAGNOSIS — G40909 Epilepsy, unspecified, not intractable, without status epilepticus: Secondary | ICD-10-CM | POA: Diagnosis not present

## 2015-03-03 DIAGNOSIS — R278 Other lack of coordination: Secondary | ICD-10-CM | POA: Diagnosis not present

## 2015-03-03 DIAGNOSIS — M6380 Disorders of muscle in diseases classified elsewhere, unspecified site: Secondary | ICD-10-CM | POA: Diagnosis not present

## 2015-03-03 DIAGNOSIS — E0821 Diabetes mellitus due to underlying condition with diabetic nephropathy: Secondary | ICD-10-CM | POA: Diagnosis not present

## 2015-03-03 DIAGNOSIS — G40909 Epilepsy, unspecified, not intractable, without status epilepticus: Secondary | ICD-10-CM | POA: Diagnosis not present

## 2015-03-03 DIAGNOSIS — M24572 Contracture, left ankle: Secondary | ICD-10-CM | POA: Diagnosis not present

## 2015-03-03 DIAGNOSIS — M24571 Contracture, right ankle: Secondary | ICD-10-CM | POA: Diagnosis not present

## 2015-03-03 DIAGNOSIS — M6281 Muscle weakness (generalized): Secondary | ICD-10-CM | POA: Diagnosis not present

## 2015-03-04 DIAGNOSIS — M6380 Disorders of muscle in diseases classified elsewhere, unspecified site: Secondary | ICD-10-CM | POA: Diagnosis not present

## 2015-03-04 DIAGNOSIS — M24571 Contracture, right ankle: Secondary | ICD-10-CM | POA: Diagnosis not present

## 2015-03-04 DIAGNOSIS — G40909 Epilepsy, unspecified, not intractable, without status epilepticus: Secondary | ICD-10-CM | POA: Diagnosis not present

## 2015-03-04 DIAGNOSIS — M6281 Muscle weakness (generalized): Secondary | ICD-10-CM | POA: Diagnosis not present

## 2015-03-04 DIAGNOSIS — R278 Other lack of coordination: Secondary | ICD-10-CM | POA: Diagnosis not present

## 2015-03-04 DIAGNOSIS — M24572 Contracture, left ankle: Secondary | ICD-10-CM | POA: Diagnosis not present

## 2015-03-05 DIAGNOSIS — R278 Other lack of coordination: Secondary | ICD-10-CM | POA: Diagnosis not present

## 2015-03-05 DIAGNOSIS — M24572 Contracture, left ankle: Secondary | ICD-10-CM | POA: Diagnosis not present

## 2015-03-05 DIAGNOSIS — M6281 Muscle weakness (generalized): Secondary | ICD-10-CM | POA: Diagnosis not present

## 2015-03-05 DIAGNOSIS — G40909 Epilepsy, unspecified, not intractable, without status epilepticus: Secondary | ICD-10-CM | POA: Diagnosis not present

## 2015-03-05 DIAGNOSIS — M6380 Disorders of muscle in diseases classified elsewhere, unspecified site: Secondary | ICD-10-CM | POA: Diagnosis not present

## 2015-03-05 DIAGNOSIS — M24571 Contracture, right ankle: Secondary | ICD-10-CM | POA: Diagnosis not present

## 2015-03-06 DIAGNOSIS — M6281 Muscle weakness (generalized): Secondary | ICD-10-CM | POA: Diagnosis not present

## 2015-03-06 DIAGNOSIS — M6380 Disorders of muscle in diseases classified elsewhere, unspecified site: Secondary | ICD-10-CM | POA: Diagnosis not present

## 2015-03-06 DIAGNOSIS — M24572 Contracture, left ankle: Secondary | ICD-10-CM | POA: Diagnosis not present

## 2015-03-06 DIAGNOSIS — M24571 Contracture, right ankle: Secondary | ICD-10-CM | POA: Diagnosis not present

## 2015-03-06 DIAGNOSIS — R278 Other lack of coordination: Secondary | ICD-10-CM | POA: Diagnosis not present

## 2015-03-06 DIAGNOSIS — G40909 Epilepsy, unspecified, not intractable, without status epilepticus: Secondary | ICD-10-CM | POA: Diagnosis not present

## 2015-03-08 DIAGNOSIS — M6380 Disorders of muscle in diseases classified elsewhere, unspecified site: Secondary | ICD-10-CM | POA: Diagnosis not present

## 2015-03-08 DIAGNOSIS — G40909 Epilepsy, unspecified, not intractable, without status epilepticus: Secondary | ICD-10-CM | POA: Diagnosis not present

## 2015-03-08 DIAGNOSIS — M24572 Contracture, left ankle: Secondary | ICD-10-CM | POA: Diagnosis not present

## 2015-03-08 DIAGNOSIS — M24571 Contracture, right ankle: Secondary | ICD-10-CM | POA: Diagnosis not present

## 2015-03-08 DIAGNOSIS — R278 Other lack of coordination: Secondary | ICD-10-CM | POA: Diagnosis not present

## 2015-03-08 DIAGNOSIS — M6281 Muscle weakness (generalized): Secondary | ICD-10-CM | POA: Diagnosis not present

## 2015-03-09 DIAGNOSIS — G40909 Epilepsy, unspecified, not intractable, without status epilepticus: Secondary | ICD-10-CM | POA: Diagnosis not present

## 2015-03-09 DIAGNOSIS — M24572 Contracture, left ankle: Secondary | ICD-10-CM | POA: Diagnosis not present

## 2015-03-09 DIAGNOSIS — M6380 Disorders of muscle in diseases classified elsewhere, unspecified site: Secondary | ICD-10-CM | POA: Diagnosis not present

## 2015-03-09 DIAGNOSIS — M6281 Muscle weakness (generalized): Secondary | ICD-10-CM | POA: Diagnosis not present

## 2015-03-09 DIAGNOSIS — R278 Other lack of coordination: Secondary | ICD-10-CM | POA: Diagnosis not present

## 2015-03-09 DIAGNOSIS — M24571 Contracture, right ankle: Secondary | ICD-10-CM | POA: Diagnosis not present

## 2015-03-10 DIAGNOSIS — M6281 Muscle weakness (generalized): Secondary | ICD-10-CM | POA: Diagnosis not present

## 2015-03-10 DIAGNOSIS — M24572 Contracture, left ankle: Secondary | ICD-10-CM | POA: Diagnosis not present

## 2015-03-10 DIAGNOSIS — M24571 Contracture, right ankle: Secondary | ICD-10-CM | POA: Diagnosis not present

## 2015-03-10 DIAGNOSIS — R278 Other lack of coordination: Secondary | ICD-10-CM | POA: Diagnosis not present

## 2015-03-10 DIAGNOSIS — G40909 Epilepsy, unspecified, not intractable, without status epilepticus: Secondary | ICD-10-CM | POA: Diagnosis not present

## 2015-03-10 DIAGNOSIS — M6380 Disorders of muscle in diseases classified elsewhere, unspecified site: Secondary | ICD-10-CM | POA: Diagnosis not present

## 2015-03-11 DIAGNOSIS — M24571 Contracture, right ankle: Secondary | ICD-10-CM | POA: Diagnosis not present

## 2015-03-11 DIAGNOSIS — M6281 Muscle weakness (generalized): Secondary | ICD-10-CM | POA: Diagnosis not present

## 2015-03-11 DIAGNOSIS — L89154 Pressure ulcer of sacral region, stage 4: Secondary | ICD-10-CM | POA: Diagnosis not present

## 2015-03-11 DIAGNOSIS — L98499 Non-pressure chronic ulcer of skin of other sites with unspecified severity: Secondary | ICD-10-CM | POA: Diagnosis not present

## 2015-03-11 DIAGNOSIS — M6380 Disorders of muscle in diseases classified elsewhere, unspecified site: Secondary | ICD-10-CM | POA: Diagnosis not present

## 2015-03-11 DIAGNOSIS — R278 Other lack of coordination: Secondary | ICD-10-CM | POA: Diagnosis not present

## 2015-03-11 DIAGNOSIS — M24572 Contracture, left ankle: Secondary | ICD-10-CM | POA: Diagnosis not present

## 2015-03-11 DIAGNOSIS — G40909 Epilepsy, unspecified, not intractable, without status epilepticus: Secondary | ICD-10-CM | POA: Diagnosis not present

## 2015-03-12 DIAGNOSIS — M6281 Muscle weakness (generalized): Secondary | ICD-10-CM | POA: Diagnosis not present

## 2015-03-12 DIAGNOSIS — M24572 Contracture, left ankle: Secondary | ICD-10-CM | POA: Diagnosis not present

## 2015-03-12 DIAGNOSIS — M6380 Disorders of muscle in diseases classified elsewhere, unspecified site: Secondary | ICD-10-CM | POA: Diagnosis not present

## 2015-03-12 DIAGNOSIS — G40909 Epilepsy, unspecified, not intractable, without status epilepticus: Secondary | ICD-10-CM | POA: Diagnosis not present

## 2015-03-12 DIAGNOSIS — M24571 Contracture, right ankle: Secondary | ICD-10-CM | POA: Diagnosis not present

## 2015-03-12 DIAGNOSIS — R278 Other lack of coordination: Secondary | ICD-10-CM | POA: Diagnosis not present

## 2015-03-15 DIAGNOSIS — M24572 Contracture, left ankle: Secondary | ICD-10-CM | POA: Diagnosis not present

## 2015-03-15 DIAGNOSIS — M6281 Muscle weakness (generalized): Secondary | ICD-10-CM | POA: Diagnosis not present

## 2015-03-15 DIAGNOSIS — L89154 Pressure ulcer of sacral region, stage 4: Secondary | ICD-10-CM | POA: Diagnosis not present

## 2015-03-15 DIAGNOSIS — M24571 Contracture, right ankle: Secondary | ICD-10-CM | POA: Diagnosis not present

## 2015-03-15 DIAGNOSIS — G40909 Epilepsy, unspecified, not intractable, without status epilepticus: Secondary | ICD-10-CM | POA: Diagnosis not present

## 2015-03-15 DIAGNOSIS — R278 Other lack of coordination: Secondary | ICD-10-CM | POA: Diagnosis not present

## 2015-03-15 DIAGNOSIS — M6380 Disorders of muscle in diseases classified elsewhere, unspecified site: Secondary | ICD-10-CM | POA: Diagnosis not present

## 2015-03-16 DIAGNOSIS — R278 Other lack of coordination: Secondary | ICD-10-CM | POA: Diagnosis not present

## 2015-03-16 DIAGNOSIS — M6380 Disorders of muscle in diseases classified elsewhere, unspecified site: Secondary | ICD-10-CM | POA: Diagnosis not present

## 2015-03-16 DIAGNOSIS — M6281 Muscle weakness (generalized): Secondary | ICD-10-CM | POA: Diagnosis not present

## 2015-03-16 DIAGNOSIS — M24571 Contracture, right ankle: Secondary | ICD-10-CM | POA: Diagnosis not present

## 2015-03-16 DIAGNOSIS — M24572 Contracture, left ankle: Secondary | ICD-10-CM | POA: Diagnosis not present

## 2015-03-16 DIAGNOSIS — G40909 Epilepsy, unspecified, not intractable, without status epilepticus: Secondary | ICD-10-CM | POA: Diagnosis not present

## 2015-03-17 DIAGNOSIS — R278 Other lack of coordination: Secondary | ICD-10-CM | POA: Diagnosis not present

## 2015-03-17 DIAGNOSIS — M6281 Muscle weakness (generalized): Secondary | ICD-10-CM | POA: Diagnosis not present

## 2015-03-17 DIAGNOSIS — M24572 Contracture, left ankle: Secondary | ICD-10-CM | POA: Diagnosis not present

## 2015-03-17 DIAGNOSIS — M6380 Disorders of muscle in diseases classified elsewhere, unspecified site: Secondary | ICD-10-CM | POA: Diagnosis not present

## 2015-03-17 DIAGNOSIS — E559 Vitamin D deficiency, unspecified: Secondary | ICD-10-CM | POA: Diagnosis not present

## 2015-03-17 DIAGNOSIS — M24571 Contracture, right ankle: Secondary | ICD-10-CM | POA: Diagnosis not present

## 2015-03-17 DIAGNOSIS — G40909 Epilepsy, unspecified, not intractable, without status epilepticus: Secondary | ICD-10-CM | POA: Diagnosis not present

## 2015-03-18 DIAGNOSIS — M24572 Contracture, left ankle: Secondary | ICD-10-CM | POA: Diagnosis not present

## 2015-03-18 DIAGNOSIS — R52 Pain, unspecified: Secondary | ICD-10-CM | POA: Diagnosis not present

## 2015-03-18 DIAGNOSIS — G40909 Epilepsy, unspecified, not intractable, without status epilepticus: Secondary | ICD-10-CM | POA: Diagnosis not present

## 2015-03-18 DIAGNOSIS — M6281 Muscle weakness (generalized): Secondary | ICD-10-CM | POA: Diagnosis not present

## 2015-03-18 DIAGNOSIS — M6380 Disorders of muscle in diseases classified elsewhere, unspecified site: Secondary | ICD-10-CM | POA: Diagnosis not present

## 2015-03-18 DIAGNOSIS — R278 Other lack of coordination: Secondary | ICD-10-CM | POA: Diagnosis not present

## 2015-03-18 DIAGNOSIS — M24571 Contracture, right ankle: Secondary | ICD-10-CM | POA: Diagnosis not present

## 2015-03-19 DIAGNOSIS — M6281 Muscle weakness (generalized): Secondary | ICD-10-CM | POA: Diagnosis not present

## 2015-03-19 DIAGNOSIS — R278 Other lack of coordination: Secondary | ICD-10-CM | POA: Diagnosis not present

## 2015-03-19 DIAGNOSIS — M24572 Contracture, left ankle: Secondary | ICD-10-CM | POA: Diagnosis not present

## 2015-03-19 DIAGNOSIS — M6380 Disorders of muscle in diseases classified elsewhere, unspecified site: Secondary | ICD-10-CM | POA: Diagnosis not present

## 2015-03-19 DIAGNOSIS — M24571 Contracture, right ankle: Secondary | ICD-10-CM | POA: Diagnosis not present

## 2015-03-19 DIAGNOSIS — G40909 Epilepsy, unspecified, not intractable, without status epilepticus: Secondary | ICD-10-CM | POA: Diagnosis not present

## 2015-03-22 DIAGNOSIS — M6380 Disorders of muscle in diseases classified elsewhere, unspecified site: Secondary | ICD-10-CM | POA: Diagnosis not present

## 2015-03-22 DIAGNOSIS — G40909 Epilepsy, unspecified, not intractable, without status epilepticus: Secondary | ICD-10-CM | POA: Diagnosis not present

## 2015-03-22 DIAGNOSIS — M6281 Muscle weakness (generalized): Secondary | ICD-10-CM | POA: Diagnosis not present

## 2015-03-22 DIAGNOSIS — M24571 Contracture, right ankle: Secondary | ICD-10-CM | POA: Diagnosis not present

## 2015-03-22 DIAGNOSIS — R278 Other lack of coordination: Secondary | ICD-10-CM | POA: Diagnosis not present

## 2015-03-22 DIAGNOSIS — M24572 Contracture, left ankle: Secondary | ICD-10-CM | POA: Diagnosis not present

## 2015-03-23 DIAGNOSIS — M24572 Contracture, left ankle: Secondary | ICD-10-CM | POA: Diagnosis not present

## 2015-03-23 DIAGNOSIS — M6281 Muscle weakness (generalized): Secondary | ICD-10-CM | POA: Diagnosis not present

## 2015-03-23 DIAGNOSIS — M6380 Disorders of muscle in diseases classified elsewhere, unspecified site: Secondary | ICD-10-CM | POA: Diagnosis not present

## 2015-03-23 DIAGNOSIS — G40909 Epilepsy, unspecified, not intractable, without status epilepticus: Secondary | ICD-10-CM | POA: Diagnosis not present

## 2015-03-23 DIAGNOSIS — R278 Other lack of coordination: Secondary | ICD-10-CM | POA: Diagnosis not present

## 2015-03-23 DIAGNOSIS — R140 Abdominal distension (gaseous): Secondary | ICD-10-CM | POA: Diagnosis not present

## 2015-03-23 DIAGNOSIS — M24571 Contracture, right ankle: Secondary | ICD-10-CM | POA: Diagnosis not present

## 2015-03-24 DIAGNOSIS — G40909 Epilepsy, unspecified, not intractable, without status epilepticus: Secondary | ICD-10-CM | POA: Diagnosis not present

## 2015-03-24 DIAGNOSIS — M24572 Contracture, left ankle: Secondary | ICD-10-CM | POA: Diagnosis not present

## 2015-03-24 DIAGNOSIS — K567 Ileus, unspecified: Secondary | ICD-10-CM | POA: Diagnosis not present

## 2015-03-24 DIAGNOSIS — M6281 Muscle weakness (generalized): Secondary | ICD-10-CM | POA: Diagnosis not present

## 2015-03-24 DIAGNOSIS — M6380 Disorders of muscle in diseases classified elsewhere, unspecified site: Secondary | ICD-10-CM | POA: Diagnosis not present

## 2015-03-24 DIAGNOSIS — R278 Other lack of coordination: Secondary | ICD-10-CM | POA: Diagnosis not present

## 2015-03-24 DIAGNOSIS — M24571 Contracture, right ankle: Secondary | ICD-10-CM | POA: Diagnosis not present

## 2015-03-25 DIAGNOSIS — M6281 Muscle weakness (generalized): Secondary | ICD-10-CM | POA: Diagnosis not present

## 2015-03-25 DIAGNOSIS — L98499 Non-pressure chronic ulcer of skin of other sites with unspecified severity: Secondary | ICD-10-CM | POA: Diagnosis not present

## 2015-03-25 DIAGNOSIS — R278 Other lack of coordination: Secondary | ICD-10-CM | POA: Diagnosis not present

## 2015-03-25 DIAGNOSIS — L89154 Pressure ulcer of sacral region, stage 4: Secondary | ICD-10-CM | POA: Diagnosis not present

## 2015-03-25 DIAGNOSIS — M24571 Contracture, right ankle: Secondary | ICD-10-CM | POA: Diagnosis not present

## 2015-03-25 DIAGNOSIS — M24572 Contracture, left ankle: Secondary | ICD-10-CM | POA: Diagnosis not present

## 2015-03-25 DIAGNOSIS — M6380 Disorders of muscle in diseases classified elsewhere, unspecified site: Secondary | ICD-10-CM | POA: Diagnosis not present

## 2015-03-25 DIAGNOSIS — G40909 Epilepsy, unspecified, not intractable, without status epilepticus: Secondary | ICD-10-CM | POA: Diagnosis not present

## 2015-03-26 DIAGNOSIS — R278 Other lack of coordination: Secondary | ICD-10-CM | POA: Diagnosis not present

## 2015-03-26 DIAGNOSIS — G40909 Epilepsy, unspecified, not intractable, without status epilepticus: Secondary | ICD-10-CM | POA: Diagnosis not present

## 2015-03-26 DIAGNOSIS — M24571 Contracture, right ankle: Secondary | ICD-10-CM | POA: Diagnosis not present

## 2015-03-26 DIAGNOSIS — M6281 Muscle weakness (generalized): Secondary | ICD-10-CM | POA: Diagnosis not present

## 2015-03-26 DIAGNOSIS — M6380 Disorders of muscle in diseases classified elsewhere, unspecified site: Secondary | ICD-10-CM | POA: Diagnosis not present

## 2015-03-26 DIAGNOSIS — M24572 Contracture, left ankle: Secondary | ICD-10-CM | POA: Diagnosis not present

## 2015-03-28 DIAGNOSIS — E538 Deficiency of other specified B group vitamins: Secondary | ICD-10-CM | POA: Diagnosis not present

## 2015-03-28 DIAGNOSIS — K6389 Other specified diseases of intestine: Secondary | ICD-10-CM | POA: Diagnosis not present

## 2015-03-28 DIAGNOSIS — F319 Bipolar disorder, unspecified: Secondary | ICD-10-CM | POA: Diagnosis not present

## 2015-03-28 DIAGNOSIS — Z79899 Other long term (current) drug therapy: Secondary | ICD-10-CM | POA: Diagnosis not present

## 2015-03-28 DIAGNOSIS — K2970 Gastritis, unspecified, without bleeding: Secondary | ICD-10-CM | POA: Diagnosis not present

## 2015-03-28 DIAGNOSIS — L89153 Pressure ulcer of sacral region, stage 3: Secondary | ICD-10-CM | POA: Diagnosis not present

## 2015-03-28 DIAGNOSIS — Z7901 Long term (current) use of anticoagulants: Secondary | ICD-10-CM | POA: Diagnosis not present

## 2015-03-28 DIAGNOSIS — K593 Megacolon, not elsewhere classified: Secondary | ICD-10-CM | POA: Diagnosis not present

## 2015-03-28 DIAGNOSIS — R140 Abdominal distension (gaseous): Secondary | ICD-10-CM | POA: Diagnosis not present

## 2015-03-28 DIAGNOSIS — K5900 Constipation, unspecified: Secondary | ICD-10-CM | POA: Diagnosis not present

## 2015-03-28 DIAGNOSIS — Z86711 Personal history of pulmonary embolism: Secondary | ICD-10-CM | POA: Diagnosis not present

## 2015-03-28 DIAGNOSIS — R102 Pelvic and perineal pain: Secondary | ICD-10-CM | POA: Diagnosis not present

## 2015-03-28 DIAGNOSIS — Z86718 Personal history of other venous thrombosis and embolism: Secondary | ICD-10-CM | POA: Diagnosis not present

## 2015-03-28 DIAGNOSIS — K567 Ileus, unspecified: Secondary | ICD-10-CM | POA: Diagnosis not present

## 2015-03-29 DIAGNOSIS — R109 Unspecified abdominal pain: Secondary | ICD-10-CM | POA: Diagnosis not present

## 2015-03-29 DIAGNOSIS — K567 Ileus, unspecified: Secondary | ICD-10-CM | POA: Diagnosis not present

## 2015-03-29 DIAGNOSIS — R140 Abdominal distension (gaseous): Secondary | ICD-10-CM | POA: Diagnosis not present

## 2015-03-29 DIAGNOSIS — K5909 Other constipation: Secondary | ICD-10-CM | POA: Diagnosis not present

## 2015-03-29 DIAGNOSIS — K593 Megacolon, not elsewhere classified: Secondary | ICD-10-CM | POA: Diagnosis not present

## 2015-03-30 DIAGNOSIS — L89153 Pressure ulcer of sacral region, stage 3: Secondary | ICD-10-CM | POA: Diagnosis not present

## 2015-03-30 DIAGNOSIS — R102 Pelvic and perineal pain: Secondary | ICD-10-CM | POA: Diagnosis not present

## 2015-03-30 DIAGNOSIS — K567 Ileus, unspecified: Secondary | ICD-10-CM | POA: Diagnosis not present

## 2015-03-30 DIAGNOSIS — R109 Unspecified abdominal pain: Secondary | ICD-10-CM | POA: Diagnosis not present

## 2015-03-30 DIAGNOSIS — R140 Abdominal distension (gaseous): Secondary | ICD-10-CM | POA: Diagnosis not present

## 2015-03-31 DIAGNOSIS — Z86711 Personal history of pulmonary embolism: Secondary | ICD-10-CM | POA: Diagnosis not present

## 2015-03-31 DIAGNOSIS — R41841 Cognitive communication deficit: Secondary | ICD-10-CM | POA: Diagnosis not present

## 2015-03-31 DIAGNOSIS — N318 Other neuromuscular dysfunction of bladder: Secondary | ICD-10-CM | POA: Diagnosis not present

## 2015-03-31 DIAGNOSIS — F319 Bipolar disorder, unspecified: Secondary | ICD-10-CM | POA: Diagnosis not present

## 2015-03-31 DIAGNOSIS — B964 Proteus (mirabilis) (morganii) as the cause of diseases classified elsewhere: Secondary | ICD-10-CM | POA: Diagnosis not present

## 2015-03-31 DIAGNOSIS — Z86718 Personal history of other venous thrombosis and embolism: Secondary | ICD-10-CM | POA: Diagnosis not present

## 2015-03-31 DIAGNOSIS — F328 Other depressive episodes: Secondary | ICD-10-CM | POA: Diagnosis not present

## 2015-03-31 DIAGNOSIS — K567 Ileus, unspecified: Secondary | ICD-10-CM | POA: Diagnosis not present

## 2015-03-31 DIAGNOSIS — R5381 Other malaise: Secondary | ICD-10-CM | POA: Diagnosis not present

## 2015-03-31 DIAGNOSIS — M6380 Disorders of muscle in diseases classified elsewhere, unspecified site: Secondary | ICD-10-CM | POA: Diagnosis not present

## 2015-03-31 DIAGNOSIS — R278 Other lack of coordination: Secondary | ICD-10-CM | POA: Diagnosis not present

## 2015-03-31 DIAGNOSIS — N390 Urinary tract infection, site not specified: Secondary | ICD-10-CM | POA: Diagnosis not present

## 2015-03-31 DIAGNOSIS — K9429 Other complications of gastrostomy: Secondary | ICD-10-CM | POA: Diagnosis not present

## 2015-03-31 DIAGNOSIS — R140 Abdominal distension (gaseous): Secondary | ICD-10-CM | POA: Diagnosis not present

## 2015-03-31 DIAGNOSIS — M24571 Contracture, right ankle: Secondary | ICD-10-CM | POA: Diagnosis not present

## 2015-03-31 DIAGNOSIS — M6281 Muscle weakness (generalized): Secondary | ICD-10-CM | POA: Diagnosis not present

## 2015-03-31 DIAGNOSIS — K219 Gastro-esophageal reflux disease without esophagitis: Secondary | ICD-10-CM | POA: Diagnosis not present

## 2015-03-31 DIAGNOSIS — L98499 Non-pressure chronic ulcer of skin of other sites with unspecified severity: Secondary | ICD-10-CM | POA: Diagnosis not present

## 2015-03-31 DIAGNOSIS — K593 Megacolon, not elsewhere classified: Secondary | ICD-10-CM | POA: Diagnosis not present

## 2015-03-31 DIAGNOSIS — K810 Acute cholecystitis: Secondary | ICD-10-CM | POA: Diagnosis not present

## 2015-03-31 DIAGNOSIS — L89154 Pressure ulcer of sacral region, stage 4: Secondary | ICD-10-CM | POA: Diagnosis not present

## 2015-03-31 DIAGNOSIS — R21 Rash and other nonspecific skin eruption: Secondary | ICD-10-CM | POA: Diagnosis not present

## 2015-03-31 DIAGNOSIS — M24572 Contracture, left ankle: Secondary | ICD-10-CM | POA: Diagnosis not present

## 2015-03-31 DIAGNOSIS — M62830 Muscle spasm of back: Secondary | ICD-10-CM | POA: Diagnosis not present

## 2015-03-31 DIAGNOSIS — K5900 Constipation, unspecified: Secondary | ICD-10-CM | POA: Diagnosis not present

## 2015-03-31 DIAGNOSIS — Z79899 Other long term (current) drug therapy: Secondary | ICD-10-CM | POA: Diagnosis not present

## 2015-03-31 DIAGNOSIS — Z431 Encounter for attention to gastrostomy: Secondary | ICD-10-CM | POA: Diagnosis not present

## 2015-03-31 DIAGNOSIS — L89153 Pressure ulcer of sacral region, stage 3: Secondary | ICD-10-CM | POA: Diagnosis not present

## 2015-03-31 DIAGNOSIS — Z7901 Long term (current) use of anticoagulants: Secondary | ICD-10-CM | POA: Diagnosis not present

## 2015-03-31 DIAGNOSIS — E538 Deficiency of other specified B group vitamins: Secondary | ICD-10-CM | POA: Diagnosis not present

## 2015-03-31 DIAGNOSIS — D649 Anemia, unspecified: Secondary | ICD-10-CM | POA: Diagnosis not present

## 2015-03-31 DIAGNOSIS — I7389 Other specified peripheral vascular diseases: Secondary | ICD-10-CM | POA: Diagnosis not present

## 2015-03-31 DIAGNOSIS — R109 Unspecified abdominal pain: Secondary | ICD-10-CM | POA: Diagnosis not present

## 2015-03-31 DIAGNOSIS — M62838 Other muscle spasm: Secondary | ICD-10-CM | POA: Diagnosis not present

## 2015-03-31 DIAGNOSIS — E876 Hypokalemia: Secondary | ICD-10-CM | POA: Diagnosis not present

## 2015-04-01 DIAGNOSIS — K567 Ileus, unspecified: Secondary | ICD-10-CM | POA: Diagnosis not present

## 2015-04-05 DIAGNOSIS — K9429 Other complications of gastrostomy: Secondary | ICD-10-CM | POA: Diagnosis not present

## 2015-04-08 DIAGNOSIS — L98499 Non-pressure chronic ulcer of skin of other sites with unspecified severity: Secondary | ICD-10-CM | POA: Diagnosis not present

## 2015-04-10 DIAGNOSIS — Z79899 Other long term (current) drug therapy: Secondary | ICD-10-CM | POA: Diagnosis not present

## 2015-04-10 DIAGNOSIS — F09 Unspecified mental disorder due to known physiological condition: Secondary | ICD-10-CM | POA: Diagnosis not present

## 2015-04-10 DIAGNOSIS — L89153 Pressure ulcer of sacral region, stage 3: Secondary | ICD-10-CM | POA: Diagnosis not present

## 2015-04-10 DIAGNOSIS — R0600 Dyspnea, unspecified: Secondary | ICD-10-CM | POA: Diagnosis not present

## 2015-04-10 DIAGNOSIS — K567 Ileus, unspecified: Secondary | ICD-10-CM | POA: Diagnosis not present

## 2015-04-10 DIAGNOSIS — M24571 Contracture, right ankle: Secondary | ICD-10-CM | POA: Diagnosis not present

## 2015-04-10 DIAGNOSIS — I4901 Ventricular fibrillation: Secondary | ICD-10-CM | POA: Diagnosis not present

## 2015-04-10 DIAGNOSIS — R21 Rash and other nonspecific skin eruption: Secondary | ICD-10-CM | POA: Diagnosis not present

## 2015-04-10 DIAGNOSIS — Z452 Encounter for adjustment and management of vascular access device: Secondary | ICD-10-CM | POA: Diagnosis not present

## 2015-04-10 DIAGNOSIS — R5381 Other malaise: Secondary | ICD-10-CM | POA: Diagnosis not present

## 2015-04-10 DIAGNOSIS — M6281 Muscle weakness (generalized): Secondary | ICD-10-CM | POA: Diagnosis not present

## 2015-04-10 DIAGNOSIS — E785 Hyperlipidemia, unspecified: Secondary | ICD-10-CM | POA: Diagnosis not present

## 2015-04-10 DIAGNOSIS — R079 Chest pain, unspecified: Secondary | ICD-10-CM | POA: Diagnosis not present

## 2015-04-10 DIAGNOSIS — G931 Anoxic brain damage, not elsewhere classified: Secondary | ICD-10-CM | POA: Diagnosis not present

## 2015-04-10 DIAGNOSIS — N390 Urinary tract infection, site not specified: Secondary | ICD-10-CM | POA: Diagnosis not present

## 2015-04-10 DIAGNOSIS — F0390 Unspecified dementia without behavioral disturbance: Secondary | ICD-10-CM | POA: Diagnosis not present

## 2015-04-10 DIAGNOSIS — F319 Bipolar disorder, unspecified: Secondary | ICD-10-CM | POA: Diagnosis not present

## 2015-04-10 DIAGNOSIS — L89154 Pressure ulcer of sacral region, stage 4: Secondary | ICD-10-CM | POA: Diagnosis not present

## 2015-04-10 DIAGNOSIS — G8250 Quadriplegia, unspecified: Secondary | ICD-10-CM | POA: Diagnosis not present

## 2015-04-10 DIAGNOSIS — Z48813 Encounter for surgical aftercare following surgery on the respiratory system: Secondary | ICD-10-CM | POA: Diagnosis not present

## 2015-04-10 DIAGNOSIS — D509 Iron deficiency anemia, unspecified: Secondary | ICD-10-CM | POA: Diagnosis not present

## 2015-04-10 DIAGNOSIS — R4182 Altered mental status, unspecified: Secondary | ICD-10-CM | POA: Diagnosis not present

## 2015-04-10 DIAGNOSIS — I469 Cardiac arrest, cause unspecified: Secondary | ICD-10-CM | POA: Diagnosis not present

## 2015-04-10 DIAGNOSIS — Z7901 Long term (current) use of anticoagulants: Secondary | ICD-10-CM | POA: Diagnosis not present

## 2015-04-10 DIAGNOSIS — K5900 Constipation, unspecified: Secondary | ICD-10-CM | POA: Diagnosis not present

## 2015-04-10 DIAGNOSIS — A419 Sepsis, unspecified organism: Secondary | ICD-10-CM | POA: Diagnosis not present

## 2015-04-10 DIAGNOSIS — I440 Atrioventricular block, first degree: Secondary | ICD-10-CM | POA: Diagnosis not present

## 2015-04-10 DIAGNOSIS — Z780 Asymptomatic menopausal state: Secondary | ICD-10-CM | POA: Diagnosis not present

## 2015-04-10 DIAGNOSIS — J90 Pleural effusion, not elsewhere classified: Secondary | ICD-10-CM | POA: Diagnosis not present

## 2015-04-10 DIAGNOSIS — Z4502 Encounter for adjustment and management of automatic implantable cardiac defibrillator: Secondary | ICD-10-CM | POA: Diagnosis not present

## 2015-04-10 DIAGNOSIS — I441 Atrioventricular block, second degree: Secondary | ICD-10-CM | POA: Diagnosis not present

## 2015-04-10 DIAGNOSIS — F3289 Other specified depressive episodes: Secondary | ICD-10-CM | POA: Diagnosis not present

## 2015-04-10 DIAGNOSIS — I4581 Long QT syndrome: Secondary | ICD-10-CM | POA: Diagnosis not present

## 2015-04-10 DIAGNOSIS — R52 Pain, unspecified: Secondary | ICD-10-CM | POA: Diagnosis not present

## 2015-04-10 DIAGNOSIS — E559 Vitamin D deficiency, unspecified: Secondary | ICD-10-CM | POA: Diagnosis not present

## 2015-04-10 DIAGNOSIS — I517 Cardiomegaly: Secondary | ICD-10-CM | POA: Diagnosis not present

## 2015-04-10 DIAGNOSIS — M21371 Foot drop, right foot: Secondary | ICD-10-CM | POA: Diagnosis not present

## 2015-04-10 DIAGNOSIS — I2699 Other pulmonary embolism without acute cor pulmonale: Secondary | ICD-10-CM | POA: Diagnosis not present

## 2015-04-10 DIAGNOSIS — E539 Vitamin B deficiency, unspecified: Secondary | ICD-10-CM | POA: Diagnosis not present

## 2015-04-10 DIAGNOSIS — E46 Unspecified protein-calorie malnutrition: Secondary | ICD-10-CM | POA: Diagnosis not present

## 2015-04-10 DIAGNOSIS — F0150 Vascular dementia without behavioral disturbance: Secondary | ICD-10-CM | POA: Diagnosis not present

## 2015-04-10 DIAGNOSIS — Z86711 Personal history of pulmonary embolism: Secondary | ICD-10-CM | POA: Diagnosis not present

## 2015-04-10 DIAGNOSIS — E538 Deficiency of other specified B group vitamins: Secondary | ICD-10-CM | POA: Diagnosis not present

## 2015-04-10 DIAGNOSIS — J9601 Acute respiratory failure with hypoxia: Secondary | ICD-10-CM | POA: Diagnosis not present

## 2015-04-10 DIAGNOSIS — E162 Hypoglycemia, unspecified: Secondary | ICD-10-CM | POA: Diagnosis not present

## 2015-04-10 DIAGNOSIS — R41841 Cognitive communication deficit: Secondary | ICD-10-CM | POA: Diagnosis not present

## 2015-04-10 DIAGNOSIS — R918 Other nonspecific abnormal finding of lung field: Secondary | ICD-10-CM | POA: Diagnosis not present

## 2015-04-10 DIAGNOSIS — I472 Ventricular tachycardia: Secondary | ICD-10-CM | POA: Diagnosis not present

## 2015-04-10 DIAGNOSIS — I462 Cardiac arrest due to underlying cardiac condition: Secondary | ICD-10-CM | POA: Diagnosis not present

## 2015-04-10 DIAGNOSIS — G40909 Epilepsy, unspecified, not intractable, without status epilepticus: Secondary | ICD-10-CM | POA: Diagnosis not present

## 2015-04-10 DIAGNOSIS — D649 Anemia, unspecified: Secondary | ICD-10-CM | POA: Diagnosis not present

## 2015-04-10 DIAGNOSIS — Z9889 Other specified postprocedural states: Secondary | ICD-10-CM | POA: Diagnosis not present

## 2015-04-10 DIAGNOSIS — R569 Unspecified convulsions: Secondary | ICD-10-CM | POA: Diagnosis not present

## 2015-04-10 DIAGNOSIS — J9811 Atelectasis: Secondary | ICD-10-CM | POA: Diagnosis not present

## 2015-04-10 DIAGNOSIS — E876 Hypokalemia: Secondary | ICD-10-CM | POA: Diagnosis not present

## 2015-04-10 DIAGNOSIS — K219 Gastro-esophageal reflux disease without esophagitis: Secondary | ICD-10-CM | POA: Diagnosis not present

## 2015-04-10 DIAGNOSIS — N318 Other neuromuscular dysfunction of bladder: Secondary | ICD-10-CM | POA: Diagnosis not present

## 2015-04-10 DIAGNOSIS — K810 Acute cholecystitis: Secondary | ICD-10-CM | POA: Diagnosis not present

## 2015-04-10 DIAGNOSIS — J9600 Acute respiratory failure, unspecified whether with hypoxia or hypercapnia: Secondary | ICD-10-CM | POA: Diagnosis not present

## 2015-04-10 DIAGNOSIS — R9431 Abnormal electrocardiogram [ECG] [EKG]: Secondary | ICD-10-CM | POA: Diagnosis not present

## 2015-04-10 DIAGNOSIS — I7389 Other specified peripheral vascular diseases: Secondary | ICD-10-CM | POA: Diagnosis not present

## 2015-04-10 DIAGNOSIS — M62838 Other muscle spasm: Secondary | ICD-10-CM | POA: Diagnosis not present

## 2015-04-10 DIAGNOSIS — J181 Lobar pneumonia, unspecified organism: Secondary | ICD-10-CM | POA: Diagnosis not present

## 2015-04-10 DIAGNOSIS — M21372 Foot drop, left foot: Secondary | ICD-10-CM | POA: Diagnosis not present

## 2015-04-10 DIAGNOSIS — N898 Other specified noninflammatory disorders of vagina: Secondary | ICD-10-CM | POA: Diagnosis not present

## 2015-04-10 DIAGNOSIS — M62830 Muscle spasm of back: Secondary | ICD-10-CM | POA: Diagnosis not present

## 2015-04-10 DIAGNOSIS — Z950 Presence of cardiac pacemaker: Secondary | ICD-10-CM | POA: Diagnosis not present

## 2015-04-10 DIAGNOSIS — F328 Other depressive episodes: Secondary | ICD-10-CM | POA: Diagnosis not present

## 2015-04-10 DIAGNOSIS — Z4682 Encounter for fitting and adjustment of non-vascular catheter: Secondary | ICD-10-CM | POA: Diagnosis not present

## 2015-04-10 DIAGNOSIS — I4891 Unspecified atrial fibrillation: Secondary | ICD-10-CM | POA: Diagnosis not present

## 2015-04-10 DIAGNOSIS — M24572 Contracture, left ankle: Secondary | ICD-10-CM | POA: Diagnosis not present

## 2015-04-10 DIAGNOSIS — R109 Unspecified abdominal pain: Secondary | ICD-10-CM | POA: Diagnosis not present

## 2015-04-23 DIAGNOSIS — I472 Ventricular tachycardia: Secondary | ICD-10-CM | POA: Diagnosis not present

## 2015-04-23 DIAGNOSIS — E538 Deficiency of other specified B group vitamins: Secondary | ICD-10-CM | POA: Diagnosis not present

## 2015-04-23 DIAGNOSIS — Z86711 Personal history of pulmonary embolism: Secondary | ICD-10-CM | POA: Diagnosis not present

## 2015-04-23 DIAGNOSIS — G629 Polyneuropathy, unspecified: Secondary | ICD-10-CM | POA: Diagnosis not present

## 2015-04-23 DIAGNOSIS — Z95810 Presence of automatic (implantable) cardiac defibrillator: Secondary | ICD-10-CM | POA: Diagnosis not present

## 2015-04-23 DIAGNOSIS — N390 Urinary tract infection, site not specified: Secondary | ICD-10-CM | POA: Diagnosis not present

## 2015-04-23 DIAGNOSIS — M6281 Muscle weakness (generalized): Secondary | ICD-10-CM | POA: Diagnosis not present

## 2015-04-23 DIAGNOSIS — F3289 Other specified depressive episodes: Secondary | ICD-10-CM | POA: Diagnosis not present

## 2015-04-23 DIAGNOSIS — I462 Cardiac arrest due to underlying cardiac condition: Secondary | ICD-10-CM | POA: Diagnosis not present

## 2015-04-23 DIAGNOSIS — I7389 Other specified peripheral vascular diseases: Secondary | ICD-10-CM | POA: Diagnosis not present

## 2015-04-23 DIAGNOSIS — I82401 Acute embolism and thrombosis of unspecified deep veins of right lower extremity: Secondary | ICD-10-CM | POA: Diagnosis not present

## 2015-04-23 DIAGNOSIS — F328 Other depressive episodes: Secondary | ICD-10-CM | POA: Diagnosis not present

## 2015-04-23 DIAGNOSIS — G40909 Epilepsy, unspecified, not intractable, without status epilepticus: Secondary | ICD-10-CM | POA: Diagnosis not present

## 2015-04-23 DIAGNOSIS — I441 Atrioventricular block, second degree: Secondary | ICD-10-CM | POA: Diagnosis not present

## 2015-04-23 DIAGNOSIS — Z87440 Personal history of urinary (tract) infections: Secondary | ICD-10-CM | POA: Diagnosis not present

## 2015-04-23 DIAGNOSIS — K5900 Constipation, unspecified: Secondary | ICD-10-CM | POA: Diagnosis not present

## 2015-04-23 DIAGNOSIS — R41841 Cognitive communication deficit: Secondary | ICD-10-CM | POA: Diagnosis not present

## 2015-04-23 DIAGNOSIS — R404 Transient alteration of awareness: Secondary | ICD-10-CM | POA: Diagnosis not present

## 2015-04-23 DIAGNOSIS — L98499 Non-pressure chronic ulcer of skin of other sites with unspecified severity: Secondary | ICD-10-CM | POA: Diagnosis not present

## 2015-04-23 DIAGNOSIS — Z950 Presence of cardiac pacemaker: Secondary | ICD-10-CM | POA: Diagnosis not present

## 2015-04-23 DIAGNOSIS — K219 Gastro-esophageal reflux disease without esophagitis: Secondary | ICD-10-CM | POA: Diagnosis not present

## 2015-04-23 DIAGNOSIS — F0150 Vascular dementia without behavioral disturbance: Secondary | ICD-10-CM | POA: Diagnosis not present

## 2015-04-23 DIAGNOSIS — E559 Vitamin D deficiency, unspecified: Secondary | ICD-10-CM | POA: Diagnosis not present

## 2015-04-23 DIAGNOSIS — M4312 Spondylolisthesis, cervical region: Secondary | ICD-10-CM | POA: Diagnosis not present

## 2015-04-23 DIAGNOSIS — G931 Anoxic brain damage, not elsewhere classified: Secondary | ICD-10-CM | POA: Diagnosis not present

## 2015-04-23 DIAGNOSIS — I4581 Long QT syndrome: Secondary | ICD-10-CM | POA: Diagnosis not present

## 2015-04-23 DIAGNOSIS — Z993 Dependence on wheelchair: Secondary | ICD-10-CM | POA: Diagnosis not present

## 2015-04-23 DIAGNOSIS — F319 Bipolar disorder, unspecified: Secondary | ICD-10-CM | POA: Diagnosis not present

## 2015-04-23 DIAGNOSIS — M4802 Spinal stenosis, cervical region: Secondary | ICD-10-CM | POA: Diagnosis not present

## 2015-04-23 DIAGNOSIS — M62830 Muscle spasm of back: Secondary | ICD-10-CM | POA: Diagnosis not present

## 2015-04-23 DIAGNOSIS — R229 Localized swelling, mass and lump, unspecified: Secondary | ICD-10-CM | POA: Diagnosis not present

## 2015-04-23 DIAGNOSIS — M24571 Contracture, right ankle: Secondary | ICD-10-CM | POA: Diagnosis not present

## 2015-04-23 DIAGNOSIS — R079 Chest pain, unspecified: Secondary | ICD-10-CM | POA: Diagnosis not present

## 2015-04-23 DIAGNOSIS — M62838 Other muscle spasm: Secondary | ICD-10-CM | POA: Diagnosis not present

## 2015-04-23 DIAGNOSIS — I4901 Ventricular fibrillation: Secondary | ICD-10-CM | POA: Diagnosis not present

## 2015-04-23 DIAGNOSIS — M47812 Spondylosis without myelopathy or radiculopathy, cervical region: Secondary | ICD-10-CM | POA: Diagnosis not present

## 2015-04-23 DIAGNOSIS — R5381 Other malaise: Secondary | ICD-10-CM | POA: Diagnosis not present

## 2015-04-23 DIAGNOSIS — N318 Other neuromuscular dysfunction of bladder: Secondary | ICD-10-CM | POA: Diagnosis not present

## 2015-04-23 DIAGNOSIS — Z79899 Other long term (current) drug therapy: Secondary | ICD-10-CM | POA: Diagnosis not present

## 2015-04-23 DIAGNOSIS — Z1211 Encounter for screening for malignant neoplasm of colon: Secondary | ICD-10-CM | POA: Diagnosis not present

## 2015-04-23 DIAGNOSIS — Z9229 Personal history of other drug therapy: Secondary | ICD-10-CM | POA: Diagnosis not present

## 2015-04-23 DIAGNOSIS — K810 Acute cholecystitis: Secondary | ICD-10-CM | POA: Diagnosis not present

## 2015-04-23 DIAGNOSIS — E876 Hypokalemia: Secondary | ICD-10-CM | POA: Diagnosis not present

## 2015-04-23 DIAGNOSIS — Z8673 Personal history of transient ischemic attack (TIA), and cerebral infarction without residual deficits: Secondary | ICD-10-CM | POA: Diagnosis not present

## 2015-04-23 DIAGNOSIS — M1990 Unspecified osteoarthritis, unspecified site: Secondary | ICD-10-CM | POA: Diagnosis not present

## 2015-04-23 DIAGNOSIS — E539 Vitamin B deficiency, unspecified: Secondary | ICD-10-CM | POA: Diagnosis not present

## 2015-04-23 DIAGNOSIS — M79661 Pain in right lower leg: Secondary | ICD-10-CM | POA: Diagnosis not present

## 2015-04-23 DIAGNOSIS — R200 Anesthesia of skin: Secondary | ICD-10-CM | POA: Diagnosis not present

## 2015-04-23 DIAGNOSIS — M2550 Pain in unspecified joint: Secondary | ICD-10-CM | POA: Diagnosis not present

## 2015-04-23 DIAGNOSIS — K567 Ileus, unspecified: Secondary | ICD-10-CM | POA: Diagnosis not present

## 2015-04-23 DIAGNOSIS — D649 Anemia, unspecified: Secondary | ICD-10-CM | POA: Diagnosis not present

## 2015-04-23 DIAGNOSIS — R202 Paresthesia of skin: Secondary | ICD-10-CM | POA: Diagnosis not present

## 2015-04-23 DIAGNOSIS — I6523 Occlusion and stenosis of bilateral carotid arteries: Secondary | ICD-10-CM | POA: Diagnosis not present

## 2015-04-23 DIAGNOSIS — I469 Cardiac arrest, cause unspecified: Secondary | ICD-10-CM | POA: Diagnosis not present

## 2015-04-23 DIAGNOSIS — M24572 Contracture, left ankle: Secondary | ICD-10-CM | POA: Diagnosis not present

## 2015-04-23 DIAGNOSIS — R21 Rash and other nonspecific skin eruption: Secondary | ICD-10-CM | POA: Diagnosis not present

## 2015-04-23 DIAGNOSIS — R531 Weakness: Secondary | ICD-10-CM | POA: Diagnosis not present

## 2015-04-23 DIAGNOSIS — Z86718 Personal history of other venous thrombosis and embolism: Secondary | ICD-10-CM | POA: Diagnosis not present

## 2015-04-23 DIAGNOSIS — Z7901 Long term (current) use of anticoagulants: Secondary | ICD-10-CM | POA: Diagnosis not present

## 2015-04-23 DIAGNOSIS — L89154 Pressure ulcer of sacral region, stage 4: Secondary | ICD-10-CM | POA: Diagnosis not present

## 2015-04-23 DIAGNOSIS — I82411 Acute embolism and thrombosis of right femoral vein: Secondary | ICD-10-CM | POA: Diagnosis not present

## 2015-04-23 DIAGNOSIS — R569 Unspecified convulsions: Secondary | ICD-10-CM | POA: Diagnosis not present

## 2015-04-26 DIAGNOSIS — I4901 Ventricular fibrillation: Secondary | ICD-10-CM | POA: Diagnosis not present

## 2015-04-27 DIAGNOSIS — I469 Cardiac arrest, cause unspecified: Secondary | ICD-10-CM | POA: Diagnosis not present

## 2015-04-28 DIAGNOSIS — I4901 Ventricular fibrillation: Secondary | ICD-10-CM | POA: Diagnosis not present

## 2015-04-29 DIAGNOSIS — I4901 Ventricular fibrillation: Secondary | ICD-10-CM | POA: Diagnosis not present

## 2015-04-29 DIAGNOSIS — R5381 Other malaise: Secondary | ICD-10-CM | POA: Diagnosis not present

## 2015-04-29 DIAGNOSIS — K5900 Constipation, unspecified: Secondary | ICD-10-CM | POA: Diagnosis not present

## 2015-04-29 DIAGNOSIS — Z95810 Presence of automatic (implantable) cardiac defibrillator: Secondary | ICD-10-CM | POA: Diagnosis not present

## 2015-05-06 DIAGNOSIS — L98499 Non-pressure chronic ulcer of skin of other sites with unspecified severity: Secondary | ICD-10-CM | POA: Diagnosis not present

## 2015-05-13 DIAGNOSIS — L89154 Pressure ulcer of sacral region, stage 4: Secondary | ICD-10-CM | POA: Diagnosis not present

## 2015-05-14 DIAGNOSIS — Z1211 Encounter for screening for malignant neoplasm of colon: Secondary | ICD-10-CM | POA: Diagnosis not present

## 2015-05-14 DIAGNOSIS — F319 Bipolar disorder, unspecified: Secondary | ICD-10-CM | POA: Diagnosis not present

## 2015-05-14 DIAGNOSIS — Z9229 Personal history of other drug therapy: Secondary | ICD-10-CM | POA: Diagnosis not present

## 2015-05-18 DIAGNOSIS — R079 Chest pain, unspecified: Secondary | ICD-10-CM | POA: Diagnosis not present

## 2015-05-18 DIAGNOSIS — M4802 Spinal stenosis, cervical region: Secondary | ICD-10-CM | POA: Diagnosis not present

## 2015-05-18 DIAGNOSIS — Z8673 Personal history of transient ischemic attack (TIA), and cerebral infarction without residual deficits: Secondary | ICD-10-CM | POA: Diagnosis not present

## 2015-05-18 DIAGNOSIS — I82411 Acute embolism and thrombosis of right femoral vein: Secondary | ICD-10-CM | POA: Diagnosis not present

## 2015-05-18 DIAGNOSIS — E538 Deficiency of other specified B group vitamins: Secondary | ICD-10-CM | POA: Diagnosis not present

## 2015-05-18 DIAGNOSIS — G629 Polyneuropathy, unspecified: Secondary | ICD-10-CM | POA: Diagnosis not present

## 2015-05-18 DIAGNOSIS — G40909 Epilepsy, unspecified, not intractable, without status epilepticus: Secondary | ICD-10-CM | POA: Diagnosis not present

## 2015-05-18 DIAGNOSIS — Z86718 Personal history of other venous thrombosis and embolism: Secondary | ICD-10-CM | POA: Diagnosis not present

## 2015-05-18 DIAGNOSIS — L89154 Pressure ulcer of sacral region, stage 4: Secondary | ICD-10-CM | POA: Diagnosis not present

## 2015-05-18 DIAGNOSIS — M4312 Spondylolisthesis, cervical region: Secondary | ICD-10-CM | POA: Diagnosis not present

## 2015-05-18 DIAGNOSIS — M79661 Pain in right lower leg: Secondary | ICD-10-CM | POA: Diagnosis not present

## 2015-05-18 DIAGNOSIS — Z993 Dependence on wheelchair: Secondary | ICD-10-CM | POA: Diagnosis not present

## 2015-05-18 DIAGNOSIS — M1990 Unspecified osteoarthritis, unspecified site: Secondary | ICD-10-CM | POA: Diagnosis not present

## 2015-05-18 DIAGNOSIS — Z87440 Personal history of urinary (tract) infections: Secondary | ICD-10-CM | POA: Diagnosis not present

## 2015-05-18 DIAGNOSIS — M2550 Pain in unspecified joint: Secondary | ICD-10-CM | POA: Diagnosis not present

## 2015-05-18 DIAGNOSIS — R202 Paresthesia of skin: Secondary | ICD-10-CM | POA: Diagnosis not present

## 2015-05-18 DIAGNOSIS — G931 Anoxic brain damage, not elsewhere classified: Secondary | ICD-10-CM | POA: Diagnosis not present

## 2015-05-18 DIAGNOSIS — R200 Anesthesia of skin: Secondary | ICD-10-CM | POA: Diagnosis not present

## 2015-05-18 DIAGNOSIS — E559 Vitamin D deficiency, unspecified: Secondary | ICD-10-CM | POA: Diagnosis not present

## 2015-05-18 DIAGNOSIS — Z86711 Personal history of pulmonary embolism: Secondary | ICD-10-CM | POA: Diagnosis not present

## 2015-05-18 DIAGNOSIS — F0150 Vascular dementia without behavioral disturbance: Secondary | ICD-10-CM | POA: Diagnosis not present

## 2015-05-18 DIAGNOSIS — R531 Weakness: Secondary | ICD-10-CM | POA: Diagnosis not present

## 2015-05-18 DIAGNOSIS — M47812 Spondylosis without myelopathy or radiculopathy, cervical region: Secondary | ICD-10-CM | POA: Diagnosis not present

## 2015-05-18 DIAGNOSIS — I6523 Occlusion and stenosis of bilateral carotid arteries: Secondary | ICD-10-CM | POA: Diagnosis not present

## 2015-05-18 DIAGNOSIS — F319 Bipolar disorder, unspecified: Secondary | ICD-10-CM | POA: Diagnosis not present

## 2015-05-18 DIAGNOSIS — I82401 Acute embolism and thrombosis of unspecified deep veins of right lower extremity: Secondary | ICD-10-CM | POA: Diagnosis not present

## 2015-05-18 DIAGNOSIS — N390 Urinary tract infection, site not specified: Secondary | ICD-10-CM | POA: Diagnosis not present

## 2015-05-19 DIAGNOSIS — I4581 Long QT syndrome: Secondary | ICD-10-CM | POA: Diagnosis not present

## 2015-05-19 DIAGNOSIS — R202 Paresthesia of skin: Secondary | ICD-10-CM | POA: Diagnosis not present

## 2015-05-20 DIAGNOSIS — M2550 Pain in unspecified joint: Secondary | ICD-10-CM | POA: Diagnosis not present

## 2015-05-20 DIAGNOSIS — R202 Paresthesia of skin: Secondary | ICD-10-CM | POA: Diagnosis not present

## 2015-05-21 DIAGNOSIS — Z86711 Personal history of pulmonary embolism: Secondary | ICD-10-CM | POA: Diagnosis not present

## 2015-05-21 DIAGNOSIS — F0150 Vascular dementia without behavioral disturbance: Secondary | ICD-10-CM | POA: Diagnosis not present

## 2015-05-21 DIAGNOSIS — M2550 Pain in unspecified joint: Secondary | ICD-10-CM | POA: Diagnosis not present

## 2015-05-21 DIAGNOSIS — G629 Polyneuropathy, unspecified: Secondary | ICD-10-CM | POA: Diagnosis not present

## 2015-05-21 DIAGNOSIS — L89154 Pressure ulcer of sacral region, stage 4: Secondary | ICD-10-CM | POA: Diagnosis not present

## 2015-05-21 DIAGNOSIS — I82411 Acute embolism and thrombosis of right femoral vein: Secondary | ICD-10-CM | POA: Diagnosis not present

## 2015-05-21 DIAGNOSIS — Z8673 Personal history of transient ischemic attack (TIA), and cerebral infarction without residual deficits: Secondary | ICD-10-CM | POA: Diagnosis not present

## 2015-05-21 DIAGNOSIS — R202 Paresthesia of skin: Secondary | ICD-10-CM | POA: Diagnosis not present

## 2015-05-21 DIAGNOSIS — N390 Urinary tract infection, site not specified: Secondary | ICD-10-CM | POA: Diagnosis not present

## 2015-05-21 DIAGNOSIS — G40909 Epilepsy, unspecified, not intractable, without status epilepticus: Secondary | ICD-10-CM | POA: Diagnosis not present

## 2015-05-21 DIAGNOSIS — E559 Vitamin D deficiency, unspecified: Secondary | ICD-10-CM | POA: Diagnosis not present

## 2015-05-21 DIAGNOSIS — Z87440 Personal history of urinary (tract) infections: Secondary | ICD-10-CM | POA: Diagnosis not present

## 2015-05-21 DIAGNOSIS — E538 Deficiency of other specified B group vitamins: Secondary | ICD-10-CM | POA: Diagnosis not present

## 2015-05-21 DIAGNOSIS — M1990 Unspecified osteoarthritis, unspecified site: Secondary | ICD-10-CM | POA: Diagnosis not present

## 2015-05-21 DIAGNOSIS — Z86718 Personal history of other venous thrombosis and embolism: Secondary | ICD-10-CM | POA: Diagnosis not present

## 2015-05-21 DIAGNOSIS — R079 Chest pain, unspecified: Secondary | ICD-10-CM | POA: Diagnosis not present

## 2015-05-21 DIAGNOSIS — Z993 Dependence on wheelchair: Secondary | ICD-10-CM | POA: Diagnosis not present

## 2015-05-21 DIAGNOSIS — G931 Anoxic brain damage, not elsewhere classified: Secondary | ICD-10-CM | POA: Diagnosis not present

## 2015-05-21 DIAGNOSIS — F319 Bipolar disorder, unspecified: Secondary | ICD-10-CM | POA: Diagnosis not present

## 2015-05-21 DIAGNOSIS — M4802 Spinal stenosis, cervical region: Secondary | ICD-10-CM | POA: Diagnosis not present

## 2015-05-22 DIAGNOSIS — F0150 Vascular dementia without behavioral disturbance: Secondary | ICD-10-CM | POA: Diagnosis not present

## 2015-05-22 DIAGNOSIS — E559 Vitamin D deficiency, unspecified: Secondary | ICD-10-CM | POA: Diagnosis not present

## 2015-05-22 DIAGNOSIS — M1712 Unilateral primary osteoarthritis, left knee: Secondary | ICD-10-CM | POA: Diagnosis not present

## 2015-05-22 DIAGNOSIS — Z86718 Personal history of other venous thrombosis and embolism: Secondary | ICD-10-CM | POA: Diagnosis not present

## 2015-05-22 DIAGNOSIS — I82411 Acute embolism and thrombosis of right femoral vein: Secondary | ICD-10-CM | POA: Diagnosis not present

## 2015-05-22 DIAGNOSIS — M1990 Unspecified osteoarthritis, unspecified site: Secondary | ICD-10-CM | POA: Diagnosis not present

## 2015-05-22 DIAGNOSIS — M19032 Primary osteoarthritis, left wrist: Secondary | ICD-10-CM | POA: Diagnosis not present

## 2015-05-22 DIAGNOSIS — Z87440 Personal history of urinary (tract) infections: Secondary | ICD-10-CM | POA: Diagnosis not present

## 2015-05-22 DIAGNOSIS — E538 Deficiency of other specified B group vitamins: Secondary | ICD-10-CM | POA: Diagnosis not present

## 2015-05-22 DIAGNOSIS — G40909 Epilepsy, unspecified, not intractable, without status epilepticus: Secondary | ICD-10-CM | POA: Diagnosis not present

## 2015-05-22 DIAGNOSIS — Z8673 Personal history of transient ischemic attack (TIA), and cerebral infarction without residual deficits: Secondary | ICD-10-CM | POA: Diagnosis not present

## 2015-05-22 DIAGNOSIS — G931 Anoxic brain damage, not elsewhere classified: Secondary | ICD-10-CM | POA: Diagnosis not present

## 2015-05-22 DIAGNOSIS — M19031 Primary osteoarthritis, right wrist: Secondary | ICD-10-CM | POA: Diagnosis not present

## 2015-05-22 DIAGNOSIS — G629 Polyneuropathy, unspecified: Secondary | ICD-10-CM | POA: Diagnosis not present

## 2015-05-22 DIAGNOSIS — F319 Bipolar disorder, unspecified: Secondary | ICD-10-CM | POA: Diagnosis not present

## 2015-05-22 DIAGNOSIS — M1711 Unilateral primary osteoarthritis, right knee: Secondary | ICD-10-CM | POA: Diagnosis not present

## 2015-05-22 DIAGNOSIS — Z86711 Personal history of pulmonary embolism: Secondary | ICD-10-CM | POA: Diagnosis not present

## 2015-05-22 DIAGNOSIS — Z993 Dependence on wheelchair: Secondary | ICD-10-CM | POA: Diagnosis not present

## 2015-05-22 DIAGNOSIS — L89154 Pressure ulcer of sacral region, stage 4: Secondary | ICD-10-CM | POA: Diagnosis not present

## 2015-05-22 DIAGNOSIS — M4802 Spinal stenosis, cervical region: Secondary | ICD-10-CM | POA: Diagnosis not present

## 2015-05-23 DIAGNOSIS — Z86711 Personal history of pulmonary embolism: Secondary | ICD-10-CM | POA: Diagnosis not present

## 2015-05-23 DIAGNOSIS — Z8673 Personal history of transient ischemic attack (TIA), and cerebral infarction without residual deficits: Secondary | ICD-10-CM | POA: Diagnosis not present

## 2015-05-23 DIAGNOSIS — R202 Paresthesia of skin: Secondary | ICD-10-CM | POA: Diagnosis not present

## 2015-05-23 DIAGNOSIS — R079 Chest pain, unspecified: Secondary | ICD-10-CM | POA: Diagnosis not present

## 2015-05-23 DIAGNOSIS — Z86718 Personal history of other venous thrombosis and embolism: Secondary | ICD-10-CM | POA: Diagnosis not present

## 2015-05-23 DIAGNOSIS — G931 Anoxic brain damage, not elsewhere classified: Secondary | ICD-10-CM | POA: Diagnosis not present

## 2015-05-23 DIAGNOSIS — I82411 Acute embolism and thrombosis of right femoral vein: Secondary | ICD-10-CM | POA: Diagnosis not present

## 2015-05-23 DIAGNOSIS — Z87440 Personal history of urinary (tract) infections: Secondary | ICD-10-CM | POA: Diagnosis not present

## 2015-05-23 DIAGNOSIS — N390 Urinary tract infection, site not specified: Secondary | ICD-10-CM | POA: Diagnosis not present

## 2015-05-23 DIAGNOSIS — F319 Bipolar disorder, unspecified: Secondary | ICD-10-CM | POA: Diagnosis not present

## 2015-05-23 DIAGNOSIS — G629 Polyneuropathy, unspecified: Secondary | ICD-10-CM | POA: Diagnosis not present

## 2015-05-23 DIAGNOSIS — M4802 Spinal stenosis, cervical region: Secondary | ICD-10-CM | POA: Diagnosis not present

## 2015-05-23 DIAGNOSIS — G40909 Epilepsy, unspecified, not intractable, without status epilepticus: Secondary | ICD-10-CM | POA: Diagnosis not present

## 2015-05-23 DIAGNOSIS — Z993 Dependence on wheelchair: Secondary | ICD-10-CM | POA: Diagnosis not present

## 2015-05-23 DIAGNOSIS — F0150 Vascular dementia without behavioral disturbance: Secondary | ICD-10-CM | POA: Diagnosis not present

## 2015-05-23 DIAGNOSIS — E559 Vitamin D deficiency, unspecified: Secondary | ICD-10-CM | POA: Diagnosis not present

## 2015-05-23 DIAGNOSIS — L89154 Pressure ulcer of sacral region, stage 4: Secondary | ICD-10-CM | POA: Diagnosis not present

## 2015-05-23 DIAGNOSIS — M1990 Unspecified osteoarthritis, unspecified site: Secondary | ICD-10-CM | POA: Diagnosis not present

## 2015-05-23 DIAGNOSIS — M2550 Pain in unspecified joint: Secondary | ICD-10-CM | POA: Diagnosis not present

## 2015-05-23 DIAGNOSIS — E538 Deficiency of other specified B group vitamins: Secondary | ICD-10-CM | POA: Diagnosis not present

## 2015-05-24 DIAGNOSIS — G40909 Epilepsy, unspecified, not intractable, without status epilepticus: Secondary | ICD-10-CM | POA: Diagnosis not present

## 2015-05-24 DIAGNOSIS — R079 Chest pain, unspecified: Secondary | ICD-10-CM | POA: Diagnosis not present

## 2015-05-24 DIAGNOSIS — Z8673 Personal history of transient ischemic attack (TIA), and cerebral infarction without residual deficits: Secondary | ICD-10-CM | POA: Diagnosis not present

## 2015-05-24 DIAGNOSIS — R202 Paresthesia of skin: Secondary | ICD-10-CM | POA: Diagnosis not present

## 2015-05-24 DIAGNOSIS — G629 Polyneuropathy, unspecified: Secondary | ICD-10-CM | POA: Diagnosis not present

## 2015-05-24 DIAGNOSIS — Z993 Dependence on wheelchair: Secondary | ICD-10-CM | POA: Diagnosis not present

## 2015-05-24 DIAGNOSIS — M329 Systemic lupus erythematosus, unspecified: Secondary | ICD-10-CM | POA: Diagnosis not present

## 2015-05-24 DIAGNOSIS — Z86718 Personal history of other venous thrombosis and embolism: Secondary | ICD-10-CM | POA: Diagnosis not present

## 2015-05-24 DIAGNOSIS — F319 Bipolar disorder, unspecified: Secondary | ICD-10-CM | POA: Diagnosis not present

## 2015-05-24 DIAGNOSIS — G931 Anoxic brain damage, not elsewhere classified: Secondary | ICD-10-CM | POA: Diagnosis not present

## 2015-05-24 DIAGNOSIS — N390 Urinary tract infection, site not specified: Secondary | ICD-10-CM | POA: Diagnosis not present

## 2015-05-24 DIAGNOSIS — Z86711 Personal history of pulmonary embolism: Secondary | ICD-10-CM | POA: Diagnosis not present

## 2015-05-24 DIAGNOSIS — E538 Deficiency of other specified B group vitamins: Secondary | ICD-10-CM | POA: Diagnosis not present

## 2015-05-24 DIAGNOSIS — Z87440 Personal history of urinary (tract) infections: Secondary | ICD-10-CM | POA: Diagnosis not present

## 2015-05-24 DIAGNOSIS — M2550 Pain in unspecified joint: Secondary | ICD-10-CM | POA: Diagnosis not present

## 2015-05-24 DIAGNOSIS — F0150 Vascular dementia without behavioral disturbance: Secondary | ICD-10-CM | POA: Diagnosis not present

## 2015-05-24 DIAGNOSIS — E559 Vitamin D deficiency, unspecified: Secondary | ICD-10-CM | POA: Diagnosis not present

## 2015-05-24 DIAGNOSIS — I82411 Acute embolism and thrombosis of right femoral vein: Secondary | ICD-10-CM | POA: Diagnosis not present

## 2015-05-24 DIAGNOSIS — L89154 Pressure ulcer of sacral region, stage 4: Secondary | ICD-10-CM | POA: Diagnosis not present

## 2015-05-24 DIAGNOSIS — M1990 Unspecified osteoarthritis, unspecified site: Secondary | ICD-10-CM | POA: Diagnosis not present

## 2015-05-24 DIAGNOSIS — M4802 Spinal stenosis, cervical region: Secondary | ICD-10-CM | POA: Diagnosis not present

## 2015-05-25 DIAGNOSIS — E039 Hypothyroidism, unspecified: Secondary | ICD-10-CM | POA: Diagnosis not present

## 2015-05-25 DIAGNOSIS — M08241 Juvenile rheumatoid arthritis with systemic onset, right hand: Secondary | ICD-10-CM | POA: Diagnosis not present

## 2015-05-25 DIAGNOSIS — R21 Rash and other nonspecific skin eruption: Secondary | ICD-10-CM | POA: Diagnosis not present

## 2015-05-25 DIAGNOSIS — G9341 Metabolic encephalopathy: Secondary | ICD-10-CM | POA: Diagnosis not present

## 2015-05-25 DIAGNOSIS — M6281 Muscle weakness (generalized): Secondary | ICD-10-CM | POA: Diagnosis not present

## 2015-05-25 DIAGNOSIS — R402411 Glasgow coma scale score 13-15, in the field [EMT or ambulance]: Secondary | ICD-10-CM | POA: Diagnosis not present

## 2015-05-25 DIAGNOSIS — E559 Vitamin D deficiency, unspecified: Secondary | ICD-10-CM | POA: Diagnosis not present

## 2015-05-25 DIAGNOSIS — E876 Hypokalemia: Secondary | ICD-10-CM | POA: Diagnosis not present

## 2015-05-25 DIAGNOSIS — G629 Polyneuropathy, unspecified: Secondary | ICD-10-CM | POA: Diagnosis not present

## 2015-05-25 DIAGNOSIS — Z79899 Other long term (current) drug therapy: Secondary | ICD-10-CM | POA: Diagnosis not present

## 2015-05-25 DIAGNOSIS — I7389 Other specified peripheral vascular diseases: Secondary | ICD-10-CM | POA: Diagnosis not present

## 2015-05-25 DIAGNOSIS — Z8673 Personal history of transient ischemic attack (TIA), and cerebral infarction without residual deficits: Secondary | ICD-10-CM | POA: Diagnosis not present

## 2015-05-25 DIAGNOSIS — F319 Bipolar disorder, unspecified: Secondary | ICD-10-CM | POA: Diagnosis not present

## 2015-05-25 DIAGNOSIS — L98499 Non-pressure chronic ulcer of skin of other sites with unspecified severity: Secondary | ICD-10-CM | POA: Diagnosis not present

## 2015-05-25 DIAGNOSIS — R52 Pain, unspecified: Secondary | ICD-10-CM | POA: Diagnosis not present

## 2015-05-25 DIAGNOSIS — B9620 Unspecified Escherichia coli [E. coli] as the cause of diseases classified elsewhere: Secondary | ICD-10-CM | POA: Diagnosis not present

## 2015-05-25 DIAGNOSIS — N318 Other neuromuscular dysfunction of bladder: Secondary | ICD-10-CM | POA: Diagnosis not present

## 2015-05-25 DIAGNOSIS — M24571 Contracture, right ankle: Secondary | ICD-10-CM | POA: Diagnosis not present

## 2015-05-25 DIAGNOSIS — N390 Urinary tract infection, site not specified: Secondary | ICD-10-CM | POA: Diagnosis not present

## 2015-05-25 DIAGNOSIS — R1012 Left upper quadrant pain: Secondary | ICD-10-CM | POA: Diagnosis not present

## 2015-05-25 DIAGNOSIS — F308 Other manic episodes: Secondary | ICD-10-CM | POA: Diagnosis not present

## 2015-05-25 DIAGNOSIS — L8990 Pressure ulcer of unspecified site, unspecified stage: Secondary | ICD-10-CM | POA: Diagnosis not present

## 2015-05-25 DIAGNOSIS — Z86711 Personal history of pulmonary embolism: Secondary | ICD-10-CM | POA: Diagnosis not present

## 2015-05-25 DIAGNOSIS — G40909 Epilepsy, unspecified, not intractable, without status epilepticus: Secondary | ICD-10-CM | POA: Diagnosis not present

## 2015-05-25 DIAGNOSIS — M4802 Spinal stenosis, cervical region: Secondary | ICD-10-CM | POA: Diagnosis not present

## 2015-05-25 DIAGNOSIS — I4901 Ventricular fibrillation: Secondary | ICD-10-CM | POA: Diagnosis not present

## 2015-05-25 DIAGNOSIS — K567 Ileus, unspecified: Secondary | ICD-10-CM | POA: Diagnosis not present

## 2015-05-25 DIAGNOSIS — R140 Abdominal distension (gaseous): Secondary | ICD-10-CM | POA: Diagnosis not present

## 2015-05-25 DIAGNOSIS — G931 Anoxic brain damage, not elsewhere classified: Secondary | ICD-10-CM | POA: Diagnosis not present

## 2015-05-25 DIAGNOSIS — L89154 Pressure ulcer of sacral region, stage 4: Secondary | ICD-10-CM | POA: Diagnosis not present

## 2015-05-25 DIAGNOSIS — B379 Candidiasis, unspecified: Secondary | ICD-10-CM | POA: Diagnosis not present

## 2015-05-25 DIAGNOSIS — E539 Vitamin B deficiency, unspecified: Secondary | ICD-10-CM | POA: Diagnosis not present

## 2015-05-25 DIAGNOSIS — Z993 Dependence on wheelchair: Secondary | ICD-10-CM | POA: Diagnosis not present

## 2015-05-25 DIAGNOSIS — Z86718 Personal history of other venous thrombosis and embolism: Secondary | ICD-10-CM | POA: Diagnosis not present

## 2015-05-25 DIAGNOSIS — I499 Cardiac arrhythmia, unspecified: Secondary | ICD-10-CM | POA: Diagnosis not present

## 2015-05-25 DIAGNOSIS — E538 Deficiency of other specified B group vitamins: Secondary | ICD-10-CM | POA: Diagnosis not present

## 2015-05-25 DIAGNOSIS — D649 Anemia, unspecified: Secondary | ICD-10-CM | POA: Diagnosis not present

## 2015-05-25 DIAGNOSIS — R1084 Generalized abdominal pain: Secondary | ICD-10-CM | POA: Diagnosis not present

## 2015-05-25 DIAGNOSIS — R531 Weakness: Secondary | ICD-10-CM | POA: Diagnosis not present

## 2015-05-25 DIAGNOSIS — R070 Pain in throat: Secondary | ICD-10-CM | POA: Diagnosis not present

## 2015-05-25 DIAGNOSIS — I462 Cardiac arrest due to underlying cardiac condition: Secondary | ICD-10-CM | POA: Diagnosis not present

## 2015-05-25 DIAGNOSIS — R202 Paresthesia of skin: Secondary | ICD-10-CM | POA: Diagnosis not present

## 2015-05-25 DIAGNOSIS — Z950 Presence of cardiac pacemaker: Secondary | ICD-10-CM | POA: Diagnosis not present

## 2015-05-25 DIAGNOSIS — K5900 Constipation, unspecified: Secondary | ICD-10-CM | POA: Diagnosis not present

## 2015-05-25 DIAGNOSIS — F0150 Vascular dementia without behavioral disturbance: Secondary | ICD-10-CM | POA: Diagnosis not present

## 2015-05-25 DIAGNOSIS — R404 Transient alteration of awareness: Secondary | ICD-10-CM | POA: Diagnosis not present

## 2015-05-25 DIAGNOSIS — K769 Liver disease, unspecified: Secondary | ICD-10-CM | POA: Diagnosis not present

## 2015-05-25 DIAGNOSIS — F3289 Other specified depressive episodes: Secondary | ICD-10-CM | POA: Diagnosis not present

## 2015-05-25 DIAGNOSIS — D500 Iron deficiency anemia secondary to blood loss (chronic): Secondary | ICD-10-CM | POA: Diagnosis not present

## 2015-05-25 DIAGNOSIS — I8010 Phlebitis and thrombophlebitis of unspecified femoral vein: Secondary | ICD-10-CM | POA: Diagnosis not present

## 2015-05-25 DIAGNOSIS — R41841 Cognitive communication deficit: Secondary | ICD-10-CM | POA: Diagnosis not present

## 2015-05-25 DIAGNOSIS — M62838 Other muscle spasm: Secondary | ICD-10-CM | POA: Diagnosis not present

## 2015-05-25 DIAGNOSIS — R41840 Attention and concentration deficit: Secondary | ICD-10-CM | POA: Diagnosis not present

## 2015-05-25 DIAGNOSIS — I441 Atrioventricular block, second degree: Secondary | ICD-10-CM | POA: Diagnosis not present

## 2015-05-25 DIAGNOSIS — Z7901 Long term (current) use of anticoagulants: Secondary | ICD-10-CM | POA: Diagnosis not present

## 2015-05-25 DIAGNOSIS — G8220 Paraplegia, unspecified: Secondary | ICD-10-CM | POA: Diagnosis not present

## 2015-05-25 DIAGNOSIS — I82411 Acute embolism and thrombosis of right femoral vein: Secondary | ICD-10-CM | POA: Diagnosis not present

## 2015-05-25 DIAGNOSIS — M2550 Pain in unspecified joint: Secondary | ICD-10-CM | POA: Diagnosis not present

## 2015-05-25 DIAGNOSIS — R079 Chest pain, unspecified: Secondary | ICD-10-CM | POA: Diagnosis not present

## 2015-05-25 DIAGNOSIS — Z95810 Presence of automatic (implantable) cardiac defibrillator: Secondary | ICD-10-CM | POA: Diagnosis not present

## 2015-05-25 DIAGNOSIS — Z87440 Personal history of urinary (tract) infections: Secondary | ICD-10-CM | POA: Diagnosis not present

## 2015-05-25 DIAGNOSIS — R4182 Altered mental status, unspecified: Secondary | ICD-10-CM | POA: Diagnosis not present

## 2015-05-25 DIAGNOSIS — I4581 Long QT syndrome: Secondary | ICD-10-CM | POA: Diagnosis not present

## 2015-05-25 DIAGNOSIS — K810 Acute cholecystitis: Secondary | ICD-10-CM | POA: Diagnosis not present

## 2015-05-25 DIAGNOSIS — R918 Other nonspecific abnormal finding of lung field: Secondary | ICD-10-CM | POA: Diagnosis not present

## 2015-05-25 DIAGNOSIS — M24572 Contracture, left ankle: Secondary | ICD-10-CM | POA: Diagnosis not present

## 2015-05-25 DIAGNOSIS — M1990 Unspecified osteoarthritis, unspecified site: Secondary | ICD-10-CM | POA: Diagnosis not present

## 2015-05-25 DIAGNOSIS — Z9289 Personal history of other medical treatment: Secondary | ICD-10-CM | POA: Diagnosis not present

## 2015-05-25 DIAGNOSIS — R197 Diarrhea, unspecified: Secondary | ICD-10-CM | POA: Diagnosis not present

## 2015-05-27 DIAGNOSIS — L98499 Non-pressure chronic ulcer of skin of other sites with unspecified severity: Secondary | ICD-10-CM | POA: Diagnosis not present

## 2015-05-31 DIAGNOSIS — Z9289 Personal history of other medical treatment: Secondary | ICD-10-CM | POA: Diagnosis not present

## 2015-05-31 DIAGNOSIS — L8990 Pressure ulcer of unspecified site, unspecified stage: Secondary | ICD-10-CM | POA: Diagnosis not present

## 2015-05-31 DIAGNOSIS — R1084 Generalized abdominal pain: Secondary | ICD-10-CM | POA: Diagnosis not present

## 2015-05-31 DIAGNOSIS — R1012 Left upper quadrant pain: Secondary | ICD-10-CM | POA: Diagnosis not present

## 2015-05-31 DIAGNOSIS — K769 Liver disease, unspecified: Secondary | ICD-10-CM | POA: Diagnosis not present

## 2015-05-31 DIAGNOSIS — R918 Other nonspecific abnormal finding of lung field: Secondary | ICD-10-CM | POA: Diagnosis not present

## 2015-05-31 DIAGNOSIS — Z95810 Presence of automatic (implantable) cardiac defibrillator: Secondary | ICD-10-CM | POA: Diagnosis not present

## 2015-05-31 DIAGNOSIS — N390 Urinary tract infection, site not specified: Secondary | ICD-10-CM | POA: Diagnosis not present

## 2015-05-31 DIAGNOSIS — R140 Abdominal distension (gaseous): Secondary | ICD-10-CM | POA: Diagnosis not present

## 2015-05-31 DIAGNOSIS — I8010 Phlebitis and thrombophlebitis of unspecified femoral vein: Secondary | ICD-10-CM | POA: Diagnosis not present

## 2015-05-31 DIAGNOSIS — B379 Candidiasis, unspecified: Secondary | ICD-10-CM | POA: Diagnosis not present

## 2015-05-31 DIAGNOSIS — R531 Weakness: Secondary | ICD-10-CM | POA: Diagnosis not present

## 2015-05-31 DIAGNOSIS — Z7901 Long term (current) use of anticoagulants: Secondary | ICD-10-CM | POA: Diagnosis not present

## 2015-06-24 DIAGNOSIS — I4901 Ventricular fibrillation: Secondary | ICD-10-CM | POA: Diagnosis not present

## 2015-07-01 DIAGNOSIS — R197 Diarrhea, unspecified: Secondary | ICD-10-CM | POA: Diagnosis not present

## 2015-07-05 DIAGNOSIS — M6281 Muscle weakness (generalized): Secondary | ICD-10-CM | POA: Diagnosis not present

## 2015-07-05 DIAGNOSIS — G8220 Paraplegia, unspecified: Secondary | ICD-10-CM | POA: Diagnosis not present

## 2015-07-09 DIAGNOSIS — M1990 Unspecified osteoarthritis, unspecified site: Secondary | ICD-10-CM | POA: Diagnosis not present

## 2015-07-16 DIAGNOSIS — R531 Weakness: Secondary | ICD-10-CM | POA: Diagnosis not present

## 2015-07-16 DIAGNOSIS — R41840 Attention and concentration deficit: Secondary | ICD-10-CM | POA: Diagnosis not present

## 2015-07-16 DIAGNOSIS — Z79899 Other long term (current) drug therapy: Secondary | ICD-10-CM | POA: Diagnosis not present

## 2015-07-16 DIAGNOSIS — R079 Chest pain, unspecified: Secondary | ICD-10-CM | POA: Diagnosis not present

## 2015-07-16 DIAGNOSIS — F23 Brief psychotic disorder: Secondary | ICD-10-CM | POA: Diagnosis not present

## 2015-07-16 DIAGNOSIS — N390 Urinary tract infection, site not specified: Secondary | ICD-10-CM | POA: Diagnosis not present

## 2015-07-16 DIAGNOSIS — Z86718 Personal history of other venous thrombosis and embolism: Secondary | ICD-10-CM | POA: Diagnosis not present

## 2015-07-16 DIAGNOSIS — Z86711 Personal history of pulmonary embolism: Secondary | ICD-10-CM | POA: Diagnosis not present

## 2015-07-16 DIAGNOSIS — B9620 Unspecified Escherichia coli [E. coli] as the cause of diseases classified elsewhere: Secondary | ICD-10-CM | POA: Diagnosis not present

## 2015-07-16 DIAGNOSIS — G9341 Metabolic encephalopathy: Secondary | ICD-10-CM | POA: Diagnosis not present

## 2015-07-16 DIAGNOSIS — Z7901 Long term (current) use of anticoagulants: Secondary | ICD-10-CM | POA: Diagnosis not present

## 2015-07-16 DIAGNOSIS — F312 Bipolar disorder, current episode manic severe with psychotic features: Secondary | ICD-10-CM | POA: Diagnosis not present

## 2015-07-16 DIAGNOSIS — F319 Bipolar disorder, unspecified: Secondary | ICD-10-CM | POA: Diagnosis not present

## 2015-07-16 DIAGNOSIS — F308 Other manic episodes: Secondary | ICD-10-CM | POA: Diagnosis not present

## 2015-07-16 DIAGNOSIS — R4182 Altered mental status, unspecified: Secondary | ICD-10-CM | POA: Diagnosis not present

## 2015-07-16 DIAGNOSIS — F05 Delirium due to known physiological condition: Secondary | ICD-10-CM | POA: Diagnosis not present

## 2015-07-17 DIAGNOSIS — Z86718 Personal history of other venous thrombosis and embolism: Secondary | ICD-10-CM | POA: Diagnosis not present

## 2015-07-17 DIAGNOSIS — N390 Urinary tract infection, site not specified: Secondary | ICD-10-CM | POA: Diagnosis not present

## 2015-07-17 DIAGNOSIS — B9620 Unspecified Escherichia coli [E. coli] as the cause of diseases classified elsewhere: Secondary | ICD-10-CM | POA: Diagnosis not present

## 2015-07-17 DIAGNOSIS — F308 Other manic episodes: Secondary | ICD-10-CM | POA: Diagnosis not present

## 2015-07-17 DIAGNOSIS — R41840 Attention and concentration deficit: Secondary | ICD-10-CM | POA: Diagnosis not present

## 2015-07-17 DIAGNOSIS — R4182 Altered mental status, unspecified: Secondary | ICD-10-CM | POA: Diagnosis not present

## 2015-07-17 DIAGNOSIS — Z79899 Other long term (current) drug therapy: Secondary | ICD-10-CM | POA: Diagnosis not present

## 2015-07-17 DIAGNOSIS — R531 Weakness: Secondary | ICD-10-CM | POA: Diagnosis not present

## 2015-07-17 DIAGNOSIS — G9341 Metabolic encephalopathy: Secondary | ICD-10-CM | POA: Diagnosis not present

## 2015-07-17 DIAGNOSIS — R079 Chest pain, unspecified: Secondary | ICD-10-CM | POA: Diagnosis not present

## 2015-07-17 DIAGNOSIS — F319 Bipolar disorder, unspecified: Secondary | ICD-10-CM | POA: Diagnosis not present

## 2015-07-17 DIAGNOSIS — Z7901 Long term (current) use of anticoagulants: Secondary | ICD-10-CM | POA: Diagnosis not present

## 2015-07-17 DIAGNOSIS — G9340 Encephalopathy, unspecified: Secondary | ICD-10-CM | POA: Diagnosis not present

## 2015-07-17 DIAGNOSIS — Z86711 Personal history of pulmonary embolism: Secondary | ICD-10-CM | POA: Diagnosis not present

## 2015-07-18 DIAGNOSIS — Z86718 Personal history of other venous thrombosis and embolism: Secondary | ICD-10-CM | POA: Diagnosis not present

## 2015-07-18 DIAGNOSIS — F308 Other manic episodes: Secondary | ICD-10-CM | POA: Diagnosis not present

## 2015-07-18 DIAGNOSIS — G9341 Metabolic encephalopathy: Secondary | ICD-10-CM | POA: Diagnosis not present

## 2015-07-18 DIAGNOSIS — R4182 Altered mental status, unspecified: Secondary | ICD-10-CM | POA: Diagnosis not present

## 2015-07-18 DIAGNOSIS — R41840 Attention and concentration deficit: Secondary | ICD-10-CM | POA: Diagnosis not present

## 2015-07-18 DIAGNOSIS — Z7901 Long term (current) use of anticoagulants: Secondary | ICD-10-CM | POA: Diagnosis not present

## 2015-07-18 DIAGNOSIS — F319 Bipolar disorder, unspecified: Secondary | ICD-10-CM | POA: Diagnosis not present

## 2015-07-18 DIAGNOSIS — Z79899 Other long term (current) drug therapy: Secondary | ICD-10-CM | POA: Diagnosis not present

## 2015-07-18 DIAGNOSIS — B9620 Unspecified Escherichia coli [E. coli] as the cause of diseases classified elsewhere: Secondary | ICD-10-CM | POA: Diagnosis not present

## 2015-07-18 DIAGNOSIS — N390 Urinary tract infection, site not specified: Secondary | ICD-10-CM | POA: Diagnosis not present

## 2015-07-18 DIAGNOSIS — R531 Weakness: Secondary | ICD-10-CM | POA: Diagnosis not present

## 2015-07-18 DIAGNOSIS — Z86711 Personal history of pulmonary embolism: Secondary | ICD-10-CM | POA: Diagnosis not present

## 2015-07-19 DIAGNOSIS — N3000 Acute cystitis without hematuria: Secondary | ICD-10-CM | POA: Diagnosis not present

## 2015-07-20 DIAGNOSIS — R4182 Altered mental status, unspecified: Secondary | ICD-10-CM | POA: Diagnosis not present

## 2015-07-20 DIAGNOSIS — N390 Urinary tract infection, site not specified: Secondary | ICD-10-CM | POA: Diagnosis not present

## 2015-07-20 DIAGNOSIS — N3000 Acute cystitis without hematuria: Secondary | ICD-10-CM | POA: Diagnosis not present

## 2015-07-20 DIAGNOSIS — F308 Other manic episodes: Secondary | ICD-10-CM | POA: Diagnosis not present

## 2015-07-20 DIAGNOSIS — F319 Bipolar disorder, unspecified: Secondary | ICD-10-CM | POA: Diagnosis not present

## 2015-07-21 DIAGNOSIS — N3000 Acute cystitis without hematuria: Secondary | ICD-10-CM | POA: Diagnosis not present

## 2015-07-22 DIAGNOSIS — Z86718 Personal history of other venous thrombosis and embolism: Secondary | ICD-10-CM | POA: Diagnosis not present

## 2015-07-22 DIAGNOSIS — Z7901 Long term (current) use of anticoagulants: Secondary | ICD-10-CM | POA: Diagnosis not present

## 2015-07-22 DIAGNOSIS — Z79899 Other long term (current) drug therapy: Secondary | ICD-10-CM | POA: Diagnosis not present

## 2015-07-22 DIAGNOSIS — F308 Other manic episodes: Secondary | ICD-10-CM | POA: Diagnosis not present

## 2015-07-22 DIAGNOSIS — N3000 Acute cystitis without hematuria: Secondary | ICD-10-CM | POA: Diagnosis not present

## 2015-07-22 DIAGNOSIS — Z86711 Personal history of pulmonary embolism: Secondary | ICD-10-CM | POA: Diagnosis not present

## 2015-07-22 DIAGNOSIS — F319 Bipolar disorder, unspecified: Secondary | ICD-10-CM | POA: Diagnosis not present

## 2015-07-22 DIAGNOSIS — R4182 Altered mental status, unspecified: Secondary | ICD-10-CM | POA: Diagnosis not present

## 2015-07-22 DIAGNOSIS — R41840 Attention and concentration deficit: Secondary | ICD-10-CM | POA: Diagnosis not present

## 2015-07-22 DIAGNOSIS — G9341 Metabolic encephalopathy: Secondary | ICD-10-CM | POA: Diagnosis not present

## 2015-07-22 DIAGNOSIS — R531 Weakness: Secondary | ICD-10-CM | POA: Diagnosis not present

## 2015-07-22 DIAGNOSIS — N390 Urinary tract infection, site not specified: Secondary | ICD-10-CM | POA: Diagnosis not present

## 2015-07-22 DIAGNOSIS — B9620 Unspecified Escherichia coli [E. coli] as the cause of diseases classified elsewhere: Secondary | ICD-10-CM | POA: Diagnosis not present

## 2015-07-23 DIAGNOSIS — Z79899 Other long term (current) drug therapy: Secondary | ICD-10-CM | POA: Diagnosis not present

## 2015-07-23 DIAGNOSIS — F319 Bipolar disorder, unspecified: Secondary | ICD-10-CM | POA: Diagnosis not present

## 2015-07-23 DIAGNOSIS — G9341 Metabolic encephalopathy: Secondary | ICD-10-CM | POA: Diagnosis not present

## 2015-07-23 DIAGNOSIS — N390 Urinary tract infection, site not specified: Secondary | ICD-10-CM | POA: Diagnosis not present

## 2015-07-23 DIAGNOSIS — R402411 Glasgow coma scale score 13-15, in the field [EMT or ambulance]: Secondary | ICD-10-CM | POA: Diagnosis not present

## 2015-07-23 DIAGNOSIS — F312 Bipolar disorder, current episode manic severe with psychotic features: Secondary | ICD-10-CM | POA: Diagnosis not present

## 2015-07-23 DIAGNOSIS — R4182 Altered mental status, unspecified: Secondary | ICD-10-CM | POA: Diagnosis not present

## 2015-07-23 DIAGNOSIS — F308 Other manic episodes: Secondary | ICD-10-CM | POA: Diagnosis not present

## 2015-07-23 DIAGNOSIS — R41840 Attention and concentration deficit: Secondary | ICD-10-CM | POA: Diagnosis not present

## 2015-07-23 DIAGNOSIS — F23 Brief psychotic disorder: Secondary | ICD-10-CM | POA: Diagnosis not present

## 2015-07-23 DIAGNOSIS — F05 Delirium due to known physiological condition: Secondary | ICD-10-CM | POA: Diagnosis not present

## 2015-07-23 DIAGNOSIS — Z86718 Personal history of other venous thrombosis and embolism: Secondary | ICD-10-CM | POA: Diagnosis not present

## 2015-07-23 DIAGNOSIS — Z7901 Long term (current) use of anticoagulants: Secondary | ICD-10-CM | POA: Diagnosis not present

## 2015-07-23 DIAGNOSIS — B9620 Unspecified Escherichia coli [E. coli] as the cause of diseases classified elsewhere: Secondary | ICD-10-CM | POA: Diagnosis not present

## 2015-07-23 DIAGNOSIS — N3000 Acute cystitis without hematuria: Secondary | ICD-10-CM | POA: Diagnosis not present

## 2015-07-23 DIAGNOSIS — R531 Weakness: Secondary | ICD-10-CM | POA: Diagnosis not present

## 2015-07-23 DIAGNOSIS — Z86711 Personal history of pulmonary embolism: Secondary | ICD-10-CM | POA: Diagnosis not present

## 2015-07-24 DIAGNOSIS — R4182 Altered mental status, unspecified: Secondary | ICD-10-CM | POA: Diagnosis not present

## 2015-07-24 DIAGNOSIS — F308 Other manic episodes: Secondary | ICD-10-CM | POA: Diagnosis not present

## 2015-07-24 DIAGNOSIS — F319 Bipolar disorder, unspecified: Secondary | ICD-10-CM | POA: Diagnosis not present

## 2022-08-24 DEATH — deceased
